# Patient Record
Sex: Female | Born: 1980 | Race: White | Hispanic: No | Marital: Married | Smoking: Never smoker
Health system: Southern US, Community
[De-identification: ages and names within clinical notes are randomized; demographics above are authoritative.]

## PROBLEM LIST (undated history)

## (undated) ENCOUNTER — Inpatient Hospital Stay: Payer: Self-pay

## (undated) DIAGNOSIS — R112 Nausea with vomiting, unspecified: Secondary | ICD-10-CM

## (undated) DIAGNOSIS — D649 Anemia, unspecified: Secondary | ICD-10-CM

## (undated) DIAGNOSIS — F329 Major depressive disorder, single episode, unspecified: Secondary | ICD-10-CM

## (undated) DIAGNOSIS — Z9889 Other specified postprocedural states: Secondary | ICD-10-CM

## (undated) DIAGNOSIS — R51 Headache: Secondary | ICD-10-CM

## (undated) DIAGNOSIS — N809 Endometriosis, unspecified: Secondary | ICD-10-CM

## (undated) DIAGNOSIS — R519 Headache, unspecified: Secondary | ICD-10-CM

## (undated) DIAGNOSIS — O139 Gestational [pregnancy-induced] hypertension without significant proteinuria, unspecified trimester: Secondary | ICD-10-CM

## (undated) DIAGNOSIS — F32A Depression, unspecified: Secondary | ICD-10-CM

## (undated) HISTORY — PX: DIAGNOSTIC LAPAROSCOPY: SUR761

## (undated) HISTORY — DX: Major depressive disorder, single episode, unspecified: F32.9

## (undated) HISTORY — DX: Depression, unspecified: F32.A

## (undated) HISTORY — DX: Endometriosis, unspecified: N80.9

## (undated) HISTORY — PX: CHOLECYSTECTOMY: SHX55

---

## 2004-12-31 ENCOUNTER — Emergency Department: Payer: Self-pay | Admitting: Emergency Medicine

## 2005-01-16 ENCOUNTER — Ambulatory Visit: Payer: Self-pay | Admitting: Unknown Physician Specialty

## 2005-03-14 ENCOUNTER — Ambulatory Visit: Payer: Self-pay | Admitting: Orthopaedic Surgery

## 2006-04-20 ENCOUNTER — Emergency Department: Payer: Self-pay | Admitting: Emergency Medicine

## 2006-04-21 ENCOUNTER — Other Ambulatory Visit: Payer: Self-pay

## 2006-05-06 ENCOUNTER — Emergency Department: Payer: Self-pay | Admitting: Emergency Medicine

## 2006-05-13 ENCOUNTER — Ambulatory Visit: Payer: Self-pay | Admitting: Family Medicine

## 2006-05-29 ENCOUNTER — Ambulatory Visit: Payer: Self-pay | Admitting: Gastroenterology

## 2006-06-27 ENCOUNTER — Ambulatory Visit: Payer: Self-pay | Admitting: Obstetrics and Gynecology

## 2007-02-19 ENCOUNTER — Ambulatory Visit: Payer: Self-pay | Admitting: Emergency Medicine

## 2007-02-21 ENCOUNTER — Ambulatory Visit: Payer: Self-pay | Admitting: Internal Medicine

## 2008-06-03 ENCOUNTER — Other Ambulatory Visit: Payer: Self-pay

## 2008-06-03 ENCOUNTER — Emergency Department: Payer: Self-pay | Admitting: Emergency Medicine

## 2008-06-30 ENCOUNTER — Ambulatory Visit: Payer: Self-pay | Admitting: Internal Medicine

## 2008-10-13 ENCOUNTER — Ambulatory Visit: Payer: Self-pay | Admitting: Sports Medicine

## 2008-11-09 ENCOUNTER — Ambulatory Visit: Payer: Self-pay | Admitting: Pain Medicine

## 2008-11-17 ENCOUNTER — Ambulatory Visit: Payer: Self-pay | Admitting: Pain Medicine

## 2008-12-01 ENCOUNTER — Ambulatory Visit: Payer: Self-pay | Admitting: Physician Assistant

## 2009-01-10 ENCOUNTER — Emergency Department: Payer: Self-pay | Admitting: Unknown Physician Specialty

## 2009-10-07 DIAGNOSIS — O139 Gestational [pregnancy-induced] hypertension without significant proteinuria, unspecified trimester: Secondary | ICD-10-CM

## 2009-10-07 HISTORY — DX: Gestational (pregnancy-induced) hypertension without significant proteinuria, unspecified trimester: O13.9

## 2009-11-23 ENCOUNTER — Encounter: Payer: Self-pay | Admitting: Maternal and Fetal Medicine

## 2010-03-23 ENCOUNTER — Observation Stay: Payer: Self-pay | Admitting: Obstetrics and Gynecology

## 2010-04-24 ENCOUNTER — Observation Stay: Payer: Self-pay | Admitting: Obstetrics and Gynecology

## 2010-05-01 ENCOUNTER — Observation Stay: Payer: Self-pay | Admitting: Obstetrics and Gynecology

## 2010-05-08 ENCOUNTER — Encounter: Payer: Self-pay | Admitting: Pediatric Cardiology

## 2010-05-14 ENCOUNTER — Observation Stay: Payer: Self-pay | Admitting: Obstetrics and Gynecology

## 2010-05-24 ENCOUNTER — Observation Stay: Payer: Self-pay | Admitting: Emergency Medicine

## 2010-05-29 ENCOUNTER — Encounter: Payer: Self-pay | Admitting: Pediatric Cardiology

## 2010-05-31 ENCOUNTER — Inpatient Hospital Stay: Payer: Self-pay

## 2010-05-31 DIAGNOSIS — O139 Gestational [pregnancy-induced] hypertension without significant proteinuria, unspecified trimester: Secondary | ICD-10-CM

## 2011-03-15 ENCOUNTER — Emergency Department: Payer: Self-pay | Admitting: Emergency Medicine

## 2014-07-22 ENCOUNTER — Emergency Department: Payer: Self-pay | Admitting: Emergency Medicine

## 2014-07-22 LAB — URINALYSIS, COMPLETE
Bilirubin,UR: NEGATIVE
Blood: NEGATIVE
GLUCOSE, UR: NEGATIVE mg/dL (ref 0–75)
KETONE: NEGATIVE
Nitrite: NEGATIVE
PROTEIN: NEGATIVE
Ph: 5 (ref 4.5–8.0)
SPECIFIC GRAVITY: 1.025 (ref 1.003–1.030)
Squamous Epithelial: 3

## 2014-07-22 LAB — LIPASE, BLOOD: Lipase: 136 U/L (ref 73–393)

## 2014-07-22 LAB — CBC WITH DIFFERENTIAL/PLATELET
BASOS ABS: 0.1 10*3/uL (ref 0.0–0.1)
Basophil %: 0.7 %
EOS PCT: 1.7 %
Eosinophil #: 0.1 10*3/uL (ref 0.0–0.7)
HCT: 45.3 % (ref 35.0–47.0)
HGB: 14.3 g/dL (ref 12.0–16.0)
LYMPHS ABS: 1.3 10*3/uL (ref 1.0–3.6)
Lymphocyte %: 15.6 %
MCH: 27.6 pg (ref 26.0–34.0)
MCHC: 31.6 g/dL — AB (ref 32.0–36.0)
MCV: 87 fL (ref 80–100)
MONO ABS: 0.6 x10 3/mm (ref 0.2–0.9)
MONOS PCT: 7.2 %
NEUTROS PCT: 74.8 %
Neutrophil #: 6 10*3/uL (ref 1.4–6.5)
PLATELETS: 379 10*3/uL (ref 150–440)
RBC: 5.19 10*6/uL (ref 3.80–5.20)
RDW: 13.9 % (ref 11.5–14.5)
WBC: 8.1 10*3/uL (ref 3.6–11.0)

## 2014-07-22 LAB — COMPREHENSIVE METABOLIC PANEL
ALK PHOS: 61 U/L
Albumin: 4.1 g/dL (ref 3.4–5.0)
Anion Gap: 7 (ref 7–16)
BILIRUBIN TOTAL: 0.6 mg/dL (ref 0.2–1.0)
BUN: 15 mg/dL (ref 7–18)
CALCIUM: 9 mg/dL (ref 8.5–10.1)
Chloride: 105 mmol/L (ref 98–107)
Co2: 25 mmol/L (ref 21–32)
Creatinine: 0.81 mg/dL (ref 0.60–1.30)
GLUCOSE: 91 mg/dL (ref 65–99)
OSMOLALITY: 274 (ref 275–301)
Potassium: 4.1 mmol/L (ref 3.5–5.1)
SGOT(AST): 19 U/L (ref 15–37)
SGPT (ALT): 22 U/L
Sodium: 137 mmol/L (ref 136–145)
Total Protein: 8.3 g/dL — ABNORMAL HIGH (ref 6.4–8.2)

## 2014-07-22 LAB — TROPONIN I: Troponin-I: <0.02 ng/mL

## 2014-07-25 ENCOUNTER — Ambulatory Visit: Payer: Self-pay | Admitting: Family Medicine

## 2014-08-01 ENCOUNTER — Ambulatory Visit: Payer: Self-pay | Admitting: Surgery

## 2014-08-02 ENCOUNTER — Ambulatory Visit: Payer: Self-pay | Admitting: Surgery

## 2014-09-16 ENCOUNTER — Ambulatory Visit: Payer: Self-pay | Admitting: Unknown Physician Specialty

## 2014-10-10 DIAGNOSIS — N803 Endometriosis of pelvic peritoneum, unspecified: Secondary | ICD-10-CM | POA: Insufficient documentation

## 2014-11-30 ENCOUNTER — Ambulatory Visit: Payer: Self-pay | Admitting: Obstetrics and Gynecology

## 2015-01-28 NOTE — Op Note (Signed)
PATIENT NAME:  Catherine Holt, Catherine Holt MR#:  [id number] DATE OF BIRTH:  1980/12/14  DATE OF PROCEDURE:  08/02/2014  PREOPERATIVE DIAGNOSIS: Chronic acalculous cholecystitis.   POSTOPERATIVE DIAGNOSIS: Chronic acalculous cholecystitis.   PROCEDURE: Laparoscopic cholecystectomy with cholangiogram.   SURGEON: Renda RollsWilton Smith, MD  ANESTHESIA: General.   INDICATIONS: This 34 year old female has a history of right upper quadrant abdominal pain and abnormally low gallbladder ejection fraction of 6%. Surgery was recommended for definitive treatment.   DESCRIPTION OF PROCEDURE: The patient was placed on the operating table in the supine position under general anesthesia. The abdomen was prepared with ChloraPrep and draped in a sterile manner. A short incision was made in the inferior aspect of the umbilicus and carried down to the deep fascia which was grasped with laryngeal hook and elevated. A Veress needle was inserted, aspirated, and irrigated with a saline solution. Next, the peritoneal cavity was inflated with carbon dioxide. The Veress needle was removed. The 10-mm cannula was inserted. The 10-mm, 0-degree laparoscope was inserted to view the peritoneal cavity. Initial inspection revealed some fatty infiltration of the liver. No other gross abnormalities were seen at this point. Another incision was made in the epigastrium, slightly to the right of the midline to introduce an 11-mm cannula. Two incisions were made in the lateral aspect of the right upper quadrant to introduce two 5-mm cannulas.   The gallbladder was retracted towards the right shoulder. It did have a lot of fatty infiltration. The porta hepatis was covered with fat. The dissection was carried out along the gallbladder neck to isolate the cystic duct from surrounding structures. The cystic artery was also dissected free from surrounding structures. The neck of the gallbladder was mobilized with incision of the visceral peritoneum. A critical view  of safety was demonstrated. An Endo Clip was placed across the cystic duct adjacent to the neck of the gallbladder. An incision was made in the cystic duct to introduce a Reddick catheter. Half-strength Conray-60 dye was injected as the cholangiogram was done with fluoroscopy demonstrating the biliary tree and prompt flow of dye into the duodenum. No retained calculi were seen. The distal common bile duct was partially obscured by overlapping shadows. The Reddick catheter was removed. The cystic duct was doubly ligated with Endo Clips and divided. The cystic artery was controlled with double Endo Clips and divided. The gallbladder was dissected free from the liver with hook and cautery and blunt dissection and completely separated. It is noted that a number of small bleeding points were cauterized. Hemostasis was subsequently intact. The gallbladder was delivered up through the infraumbilical incision and submitted in formalin for routine pathology. The right upper quadrant was further inspected. The cannulas were removed. Carbon dioxide was allowed to escape from the peritoneal cavity. Skin incisions were closed with interrupted 5-0 chromic subcuticular suture, benzoin, and Steri-Strips. Dressings were applied with paper tape. The patient tolerated surgery satisfactorily and was prepared for transfer to the recovery room.    ____________________________ Shela CommonsJ. Renda RollsWilton Smith, MD jws:lt D: 08/02/2014 12:30:03 ET T: 08/02/2014 22:16:01 ET JOB#: [id number]  cc: Adella HareJ. Wilton Smith, MD, <Dictator> Adella HareWILTON J SMITH MD ELECTRONICALLY SIGNED 08/05/2014 19:20

## 2015-01-30 LAB — SURGICAL PATHOLOGY

## 2015-03-28 DIAGNOSIS — M5430 Sciatica, unspecified side: Secondary | ICD-10-CM | POA: Insufficient documentation

## 2015-04-12 DIAGNOSIS — M79605 Pain in left leg: Secondary | ICD-10-CM | POA: Insufficient documentation

## 2015-04-12 DIAGNOSIS — R29898 Other symptoms and signs involving the musculoskeletal system: Secondary | ICD-10-CM | POA: Insufficient documentation

## 2015-04-12 DIAGNOSIS — R202 Paresthesia of skin: Secondary | ICD-10-CM | POA: Insufficient documentation

## 2015-05-26 ENCOUNTER — Encounter: Payer: Self-pay | Admitting: Neurology

## 2015-05-26 ENCOUNTER — Ambulatory Visit (INDEPENDENT_AMBULATORY_CARE_PROVIDER_SITE_OTHER): Payer: BLUE CROSS/BLUE SHIELD | Admitting: Neurology

## 2015-05-26 VITALS — BP 148/100 | HR 81 | Ht 64.0 in | Wt 208.3 lb

## 2015-05-26 DIAGNOSIS — M792 Neuralgia and neuritis, unspecified: Secondary | ICD-10-CM

## 2015-05-26 DIAGNOSIS — Z8659 Personal history of other mental and behavioral disorders: Secondary | ICD-10-CM | POA: Insufficient documentation

## 2015-05-26 DIAGNOSIS — M5417 Radiculopathy, lumbosacral region: Secondary | ICD-10-CM | POA: Diagnosis not present

## 2015-05-26 DIAGNOSIS — R202 Paresthesia of skin: Secondary | ICD-10-CM | POA: Diagnosis not present

## 2015-05-26 DIAGNOSIS — M5430 Sciatica, unspecified side: Secondary | ICD-10-CM

## 2015-05-26 MED ORDER — GABAPENTIN 300 MG PO CAPS: ORAL_CAPSULE | ORAL | Status: AC

## 2015-05-26 NOTE — Progress Notes (Signed)
[facility] Neurology Division Clinic Note - Initial Visit   Date: 05/26/2015  Catherine Holt MRN: [id number] DOB: 1980/11/12   Dear Dr. Malvin Johns:  Thank you for your kind referral of Catherine Holt for consultation of left leg pain. Although her history is well known to you, please allow [place] to reiterate it for the purpose of our medical record. The patient was accompanied to the clinic by mother who also provides collateral information.     History of Present Illness: Catherine Holt is a 34 y.o. right-handed Caucasian female with endometriosis presenting for evaluation of left leg pain.    Starting in November 2015, she developed left buttocks stabbing pain radiating into her left posterior thigh and medial lower leg.  She started physical therapy, which worsened her pain and numbness.  She has constant numbness of the sole of the left foot. Pain is worse with sitting, standing, and even laying.  Gabapentin makes her sleepy.   Pain and paresthesias spares the front of the thigh, lateral lower leg and dorsum of the foot.  Right leg is unaffected.  She endorses low back pain, which is attributed to endometriosis.  She has seen OB/GYN (Dr. Feliberto Gottron), orthopaedics (Dr. Erin Sons), and neurology (Dr. Malvin Johns). Imaging of the pelvis and lumbar spine was normal without evidence of sciatica nerve injury. She has not had any falls and does not have frank weakness, but after prolonged standing, her legs feel like "jelly".  She has tried prednisone, diclofenac, vicodin, oxycodone, cymbalta, and gabapentin; none of which provide significant relief.   She underwent cholecystectomy in October 2015.     Past Medical History  Diagnosis Date  . Depression   . Endometriosis     Past Surgical History  Procedure Laterality Date  . Cholecystectomy       Medications:  Outpatient Encounter Prescriptions as of 05/26/2015  Medication Sig  . DULoxetine (CYMBALTA) 30 MG capsule Take 30 mg by  mouth daily.  Marland Kitchen gabapentin (NEURONTIN) 300 MG capsule Take 300 mg by mouth at bedtime. 2 po qhs   No facility-administered encounter medications on file as of 05/26/2015.     Allergies:  Allergies  Allergen Reactions  . Ambien [Zolpidem Tartrate]   . Doxycycline Monohydrate Other (See Comments)    itching  . Latex Hives  . Penicillins     Family History: Family History  Problem Relation Age of Onset  . Cancer Maternal Uncle   . Hypertension Maternal Uncle   . Diabetes Mother   . Asthma Maternal Uncle   . Depression Father   . Heart disease Maternal Grandmother   . Heart disease Maternal Grandfather   . Emphysema Maternal Grandmother   . Cancer Maternal Grandfather     Social History: Social History  Substance Use Topics  . Smoking status: Never Smoker   . Smokeless tobacco: Never Used  . Alcohol Use: No   Social History   Social History Narrative   Lives with husband and child in a 1 story home.  On disability. Education: high school.    Review of Systems:  CONSTITUTIONAL: No fevers, chills, night sweats, or weight loss.   EYES: No visual changes or eye pain ENT: No hearing changes.  No history of nose bleeds.   RESPIRATORY: No cough, wheezing and shortness of breath.   CARDIOVASCULAR: Negative for chest pain, and palpitations.   GI: Negative for abdominal discomfort, blood in stools or Kueker stools.  No recent change in bowel habits.  GU:  No history of incontinence.   MUSCLOSKELETAL: +history of joint pain or swelling.  No myalgias.   SKIN: Negative for lesions, rash, and itching.   HEMATOLOGY/ONCOLOGY: Negative for prolonged bleeding, bruising easily, and swollen nodes.  No history of cancer.   ENDOCRINE: Negative for cold or heat intolerance, polydipsia or goiter.   PSYCH:  No depression or anxiety symptoms.   NEURO: As Above.   Vital Signs:  BP 148/100 mmHg  Pulse 81  Ht 5\' 4"  (1.626 m)  Wt 208 lb 5 oz (94.49 kg)  BMI 35.74 kg/m2  SpO2  98%   General Medical Exam:   General:  Well appearing, comfortable.   Eyes/ENT: see cranial nerve examination.   Neck: No masses appreciated.  Full range of motion without tenderness.  No carotid bruits. Respiratory:  Clear to auscultation, good air entry bilaterally.   Cardiac:  Regular rate and rhythm, no murmur.   Extremities:  No deformities, edema, or skin discoloration.  Skin:  No rashes or lesions.  Neurological Exam: MENTAL STATUS including orientation to time, place, person, recent and remote memory, attention span and concentration, language, and fund of knowledge is normal.  Speech is not dysarthric.  CRANIAL NERVES: II:  No visual field defects.  Unremarkable fundi.   III-IV-VI: Pupils equal round and reactive to light.  Normal conjugate, extra-ocular eye movements in all directions of gaze.  No nystagmus.  No ptosis.  V:  Normal facial sensation.    VII:  Normal facial symmetry and movements.  No pathologic facial reflexes.  VIII:  Normal hearing and vestibular function.   IX-X:  Normal palatal movement.   XI:  Normal shoulder shrug and head rotation.   XII:  Normal tongue strength and range of motion, no deviation or fasciculation.  MOTOR:  No atrophy, fasciculations or abnormal movements.  No pronator drift.  Tone is normal.    Right Upper Extremity:    Left Upper Extremity:    Deltoid  5/5   Deltoid  5/5   Biceps  5/5   Biceps  5/5   Triceps  5/5   Triceps  5/5   Wrist extensors  5/5   Wrist extensors  5/5   Wrist flexors  5/5   Wrist flexors  5/5   Finger extensors  5/5   Finger extensors  5/5   Finger flexors  5/5   Finger flexors  5/5   Dorsal interossei  5/5   Dorsal interossei  5/5   Abductor pollicis  5/5   Abductor pollicis  5/5   Tone (Ashworth scale)  0  Tone (Ashworth scale)  0   Right Lower Extremity:    Left Lower Extremity:    Hip flexors  5/5   Hip flexors  5/5   Hip extensors  5/5   Hip extensors  5-/5   Knee flexors  5/5   Knee flexors  5-/5    Knee extensors  5/5   Knee extensors  5/5   Dorsiflexors  5/5   Dorsiflexors  5/5   Plantarflexors  5/5   Plantarflexors  5/5   Inversion 5/5  Inversion 5-/5  Eversion 5/5  Eversion 5/5  Toe extensors  5/5   Toe extensors  5/5   Toe flexors  5/5   Toe flexors  5-/5   Tone (Ashworth scale)  0  Tone (Ashworth scale)  0   MSRs:  Right  Left brachioradialis 2+  brachioradialis 2+  biceps 2+  biceps 2+  triceps 2+  triceps 2+  patellar 2+  patellar 2+  ankle jerk 2+  ankle jerk 2+  Hoffman no  Hoffman no  plantar response down  plantar response down   SENSORY:  Hyperesthesia to pin prick and temperature over the left thigh and medial lower leg, reduced pin prick and temperature over the left plantar and lateral surface of the foot.   Sensation in the right lower extremity is normal.  Romberg's sign absent.   COORDINATION/GAIT: Normal finger-to- nose-finger and heel-to-shin.  Intact rapid alternating movements bilaterally. Antalgic gait favoring the right leg, unsteady with stressed and tandem gait.    IMPRESSION: Ms. Wardrop is a 34 year-old female presenting for evaluation of left leg radicular pain.  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  With S1 radiculopathy, it would be unusual to have paresthesias involving the medial lower leg.  Further, she has already had imaging of the lumbar and pelvic region which did not demonstrate nerve impingement.  I do agree with Dr. Malvin Johns that she may have dynamic nerve impingement which can be not appreciated on imaging.  To better determine the nature of her symptoms, NCS/EMG of the left lower extremity will be performed.  She may increase gabapentin to  in the morning and  at bedtime  Requested that she forward CD with images for me to review  Return to clinic in 4-6 weeks   The duration of  this appointment visit was 45 minutes of face-to-face time with the patient.  Greater than 50% of this time was spent in counseling, explanation of diagnosis, planning of further management, and coordination of care.   Thank you for allowing me to participate in patient's care.  If I can answer any additional questions, I would be pleased to do so.    Sincerely,    Donika K. Allena Katz, DO

## 2015-05-26 NOTE — Patient Instructions (Addendum)
1.  Start gabapentin  in the morning and  at bedtime 2.  EMG of the left > right  leg 3.  Return to clinic in 4-6 weeks

## 2015-05-29 NOTE — Progress Notes (Signed)
Note routed

## 2015-06-06 ENCOUNTER — Ambulatory Visit (INDEPENDENT_AMBULATORY_CARE_PROVIDER_SITE_OTHER): Payer: BLUE CROSS/BLUE SHIELD | Admitting: Neurology

## 2015-06-06 DIAGNOSIS — M5430 Sciatica, unspecified side: Secondary | ICD-10-CM | POA: Diagnosis not present

## 2015-06-06 DIAGNOSIS — M79605 Pain in left leg: Secondary | ICD-10-CM

## 2015-06-06 DIAGNOSIS — M5417 Radiculopathy, lumbosacral region: Secondary | ICD-10-CM

## 2015-06-06 DIAGNOSIS — R202 Paresthesia of skin: Secondary | ICD-10-CM

## 2015-06-06 NOTE — Procedures (Signed)
[facility] Neurology  [address] [place], [address]  [place], [place] [address] Tel: [phone] Fax:  [phone] Test Date:  06/06/2015  Patient: Catherine Holt DOB: [birth year] Physician: Nita Sickle, DO  Sex: Female Height:  Ref Phys: Nita Sickle, DO  ID#: [id number] Temp: 32.6C Technician: Judie Petit. Dean   Patient Complaints: This is a 34 year old female presenting for evaluation of low back pain radiating into her left hip and lower leg.   NCV & EMG Findings: Extensive electrodiagnostic testing of the left lower extremity and additional studies of the right shows:  1. Bilateral sural and superficial peroneal sensory responses are within normal limits. 2. Bilateral peroneal and tibial motor responses are within normal limits. 3. Bilateral H reflex studies are within normal limits. 4. There is no evidence of active or chronic motor axon loss changes affecting any of the tested muscles. Motor unit configuration and recruitment pattern is within normal limits.   Impression: This is a normal study. In particular, there is no evidence of a generalized sensorimotor polyneuropathy or lumbosacral radiculopathy affecting the left lower extremity.    ___________________________ Nita Sickle, DO    Nerve Conduction Studies Anti Sensory Summary Table   Site NR Peak (ms) Norm Peak (ms) P-T Amp (V) Norm P-T Amp  Left Sup Peroneal Anti Sensory (Ant Lat Mall)  32.6C  12 cm    2.7 <4.5 16.0 >5  Right Sup Peroneal Anti Sensory (Ant Lat Mall)  32.6C  12 cm    2.4 <4.5 17.7 >5  Left Sural Anti Sensory (Lat Mall)  32.6C  Calf    2.6 <4.5 20.2 >5  Right Sural Anti Sensory (Lat Mall)  32.6C  Calf    2.9 <4.5 14.4 >5   Motor Summary Table   Site NR Onset (ms) Norm Onset (ms) O-P Amp (mV) Norm O-P Amp Site1 Site2 Delta-0 (ms) Dist (cm) Vel (m/s) Norm Vel (m/s)  Left Peroneal Motor (Ext Dig Brev)  32.6C  Ankle    3.2 <5.5 8.9 >3 B Fib Ankle 6.5 31.0 48 >40  B Fib    9.7  8.1  Poplt B  Fib 1.8 10.0 56 >40  Poplt    11.5  7.0         Right Peroneal Motor (Ext Dig Brev)  32.6C  Ankle    2.8 <5.5 5.5 >3 B Fib Ankle 6.4 31.0 48 >40  B Fib    9.2  5.0  Poplt B Fib 1.7 10.0 59 >40  Poplt    10.9  4.8         Left Tibial Motor (Abd Hall Brev)  32.6C  Ankle    2.9 <6.0 13.9 >8 Knee Ankle 7.4 36.0 49 >40  Knee    10.3  12.4         Right Tibial Motor (Abd Hall Brev)  32.6C  Ankle    2.8 <6.0 12.4 >8 Knee Ankle 8.0 37.0 46 >40  Knee    10.8  10.5          H Reflex Studies   NR H-Lat (ms) Lat Norm (ms) L-R H-Lat (ms)  Left Tibial (Gastroc)  32.6C     30.48 <35 0.96  Right Tibial (Gastroc)  32.6C     29.52 <35 0.96   EMG   Side Muscle Ins Act Fibs Psw Fasc Number Recrt Dur Dur. Amp Amp. Poly Poly. Comment  Left AntTibialis Nml Nml Nml Nml Nml Nml Nml Nml Nml Nml Nml Nml N/A  Left Gastroc Nml Nml Nml Nml Nml Nml Nml Nml Nml Nml Nml Nml N/A  Left Flex Dig Long Nml Nml Nml Nml Nml Nml Nml Nml Nml Nml Nml Nml N/A  Left RectFemoris Nml Nml Nml Nml Nml Nml Nml Nml Nml Nml Nml Nml N/A  Left BicepsFemS Nml Nml Nml Nml Nml Nml Nml Nml Nml Nml Nml Nml N/A  Left GluteusMed Nml Nml Nml Nml Nml Nml Nml Nml Nml Nml Nml Nml N/A  Left Lumbo Parasp Low Nml Nml Nml Nml Nml Nml Nml Nml Nml Nml Nml Nml N/A  Left Semimembranosus Nml Nml Nml Nml Nml Nml Nml Nml Nml Nml Nml Nml N/A     Waveforms:

## 2015-06-08 ENCOUNTER — Ambulatory Visit: Payer: Self-pay | Admitting: Neurology

## 2015-07-07 ENCOUNTER — Ambulatory Visit (INDEPENDENT_AMBULATORY_CARE_PROVIDER_SITE_OTHER): Payer: BLUE CROSS/BLUE SHIELD | Admitting: Neurology

## 2015-07-07 ENCOUNTER — Encounter: Payer: Self-pay | Admitting: Neurology

## 2015-07-07 VITALS — BP 110/80 | HR 111 | Wt 214.1 lb

## 2015-07-07 DIAGNOSIS — M79605 Pain in left leg: Secondary | ICD-10-CM | POA: Diagnosis not present

## 2015-07-07 DIAGNOSIS — M5430 Sciatica, unspecified side: Secondary | ICD-10-CM | POA: Diagnosis not present

## 2015-07-07 MED ORDER — CYCLOBENZAPRINE HCL 5 MG PO TABS: 5.0000 mg | ORAL_TABLET | Freq: Three times a day (TID) | ORAL | Status: AC | PRN

## 2015-07-07 NOTE — Progress Notes (Signed)
Follow-up Visit   Date: 07/07/2015    Catherine Holt MRN: [id number] DOB: 05-Apr-1981   Interim History: Catherine Holt is a 34 y.o. right-handed Caucasian female with endometriosis returning to the clinic for follow-up of left leg pain.  The patient was accompanied to the clinic by mother who also provides collateral information.    History of present illness: Starting in November 2015, she developed left buttocks stabbing pain radiating into her left posterior thigh and medial lower leg. She started physical therapy, which worsened her pain and numbness. She has constant numbness of the sole of the left foot. Pain is worse with sitting, standing, and even laying. Gabapentin makes her sleepy. Pain and paresthesias spares the front of the thigh, lateral lower leg and dorsum of the foot. Right leg is unaffected. She endorses low back pain, which is attributed to endometriosis. She has seen OB/GYN (Dr. Feliberto Gottron), orthopaedics (Dr. Erin Sons), and neurology (Dr. Malvin Johns). Imaging of the pelvis and lumbar spine was normal without evidence of sciatica nerve injury. She has not had any falls and does not have frank weakness, but after prolonged standing, her legs feel like "jelly". She has tried prednisone, diclofenac, vicodin, oxycodone, cymbalta, and gabapentin; none of which provide significant relief.   She underwent cholecystectomy in October 2015.   UPDATE 07/07/2015:  She presents to discuss EMG results which was surprisingly normal, no evidence of sciatic neuropathy or lumbosacral radiculopathy.  She  feels that her leg pain has not improved and there is persistent radiating pain down her leg, especially when sitting on hard surfaces. She She is taking Cymbalta  daily and neurontin  in the morning and  at bedtime, but neither provide much relief.  She had tried PT before and stopped it due to worsening pain.    Medications:  Current Outpatient Prescriptions on  File Prior to Visit  Medication Sig Dispense Refill  . DULoxetine (CYMBALTA) 30 MG capsule Take 30 mg by mouth daily.    Marland Kitchen gabapentin (NEURONTIN) 300 MG capsule Take 1 tab in the morning and 2 tablets at bedtime 270 capsule 3   No current facility-administered medications on file prior to visit.    Allergies:  Allergies  Allergen Reactions  . Ambien [Zolpidem Tartrate]   . Doxycycline Monohydrate Other (See Comments)    itching  . Latex Hives  . Penicillins     Review of Systems:  CONSTITUTIONAL: No fevers, chills, night sweats, or weight loss.  EYES: No visual changes or eye pain ENT: No hearing changes.  No history of nose bleeds.   RESPIRATORY: No cough, wheezing and shortness of breath.   CARDIOVASCULAR: Negative for chest pain, and palpitations.   GI: Negative for abdominal discomfort, blood in stools or Robicheaux stools.  No recent change in bowel habits.   GU:  No history of incontinence.   MUSCLOSKELETAL: No history of joint pain or swelling.  +myalgias.   SKIN: Negative for lesions, rash, and itching.   ENDOCRINE: Negative for cold or heat intolerance, polydipsia or goiter.   PSYCH:  No depression or anxiety symptoms.   NEURO: As Above.   Vital Signs:  BP 110/80 mmHg  Pulse 111  Wt 214 lb 1 oz (97.098 kg)  SpO2 97%  Neurological Exam: MENTAL STATUS including orientation to time, place, person, recent and remote memory, attention span and concentration, language, and fund of knowledge is normal.  Speech is not dysarthric.  CRANIAL NERVES: Pupils equal round and reactive to light.  Face is symmetric.  MOTOR:  Motor strength is 5/5 in all extremities, except 5-/5 hip extensors and flexors.  No atrophy, fasciculations or abnormal movements.  No pronator drift.  Tone is normal.    MSRs:  Reflexes are 2+/4 throughout.  SENSORY:  Hyperesthesia to light touch over the left thigh and medial lower leg.  COORDINATION/GAIT:  Antalgic gait, favoring right leg.  Data: EMG  of the left leg 06/06/2015: This is a normal study. In particular, there is no evidence of a generalized sensorimotor polyneuropathy or lumbosacral radiculopathy affecting the left lower extremity.    IMPRESSION/PLAN: Ms. Ephraim is a 34 year-old female returning for follow-up of left leg radicular pain. 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   She may have dynamic nerve impingement which can be not appreciated on imaging. At this juncture, management is symptomatic.  Unfortunately, physical therapy made symptoms worsen.  She is taking gabapentin and cymbalta without much improvement, but there is room to titrate with each.   Patient is understandably frustrated at the lack of answers.  She is interested in seeking the opinion of Sports Medicine provider to look for musculoskeletal pain, which is reasonable.   PLAN/RECOMMENDATIONS:  1.  Start flexeril  at bedtime.  If no improvement, adjust gabapentin as follows:  at bedtime,  in the afternoon, and  at bedtime. 2.  Referral to Sports Medicine to see if there is a musculoskeletal component ?piriformis syndrome.   The duration of this appointment visit was 30 minutes of face-to-face time with the patient.  Greater than 50% of this time was spent in counseling, explanation of diagnosis, planning of further management, and coordination of care.   Thank you for allowing me to participate in patient's care.  If I can answer any additional questions, I would be pleased to do so.    Sincerely,    Donika K. Allena Katz, DO

## 2015-07-07 NOTE — Patient Instructions (Addendum)
1.  Start flexeril  at bedtime.  If no improvement, adjust gabapentin as follows:  at bedtime,  in the afternoon, and  at bedtime. 2.  Referral to Sports Medicine

## 2015-07-10 NOTE — Progress Notes (Signed)
Note faxed.

## 2015-07-13 ENCOUNTER — Ambulatory Visit
Admission: RE | Admit: 2015-07-13 | Discharge: 2015-07-13 | Disposition: A | Discharge status: Home / Self Care | Payer: BLUE CROSS/BLUE SHIELD | Source: Ambulatory Visit | Attending: Neurology | Admitting: Neurology

## 2015-07-13 ENCOUNTER — Other Ambulatory Visit: Payer: Self-pay | Admitting: Neurology

## 2015-07-13 DIAGNOSIS — M79605 Pain in left leg: Secondary | ICD-10-CM | POA: Insufficient documentation

## 2015-07-18 ENCOUNTER — Ambulatory Visit: Payer: BLUE CROSS/BLUE SHIELD | Admitting: Family Medicine

## 2015-10-08 NOTE — L&D Delivery Note (Signed)
Delivery Note  First Stage: Labor induction: 05/12/16 @2100  Cervidil induction  Augmentation: Cytotec, Pitocin Analgesia /Anesthesia intrapartum: Stadol, Epidural  SROM at 1711  Second Stage: Complete dilation at 1744 Onset of pushing at 1744 FHR second stage: Baseline: 130 bpm/ Moderate variability/ +accels/ variable decelerations to nadir of 70 bpm with good return to baseline     Delivery of a viable female "Beau" at 1752 by CNM in OA position, with easy delivery of anterior shoulder then posterior shoulder, followed by body. No nuchal cord. Baby immediately started crying and began to pink, and dried on mom's abdomen.  Skin to skin after delivery.  Cord double clamped after cessation of pulsation, cut by FOB's mother  Cord blood sample N/A   Third Stage: Placenta delivered via Shultz intact with 3 VC @ 1759 Placenta disposition: hospital disposal  Uterine tone firm with massage / bleeding minimal   1st degree perineal laceration identified  Small Peri-clitoral laceration that is hemostatic - pt. States she had a similar laceration during last delivery and declined stitch  1 figure 8 stitch placed in vaginal mucosa for additional bleeding after repair finish - closed with hemostasis   Anesthesia for repair: 1% Lidocaine 30mL and epidural Repair: 2.0 Vicryl  Est. Blood Loss (mL): 300mL  Complications: none  Mom to postpartum.  Baby to Couplet care / Skin to Skin.  Newborn: Birth Weight: Pending  Apgar Scores: 9, 9 Feeding planned: Breast and Formula  Carlean JewsMeredith Palin Tristan, CNM

## 2015-11-01 ENCOUNTER — Other Ambulatory Visit: Payer: Self-pay | Admitting: Obstetrics and Gynecology

## 2015-11-01 DIAGNOSIS — Z369 Encounter for antenatal screening, unspecified: Secondary | ICD-10-CM

## 2015-11-01 LAB — OB RESULTS CONSOLE RPR: RPR: NONREACTIVE

## 2015-11-01 LAB — OB RESULTS CONSOLE HIV ANTIBODY (ROUTINE TESTING): HIV: NONREACTIVE

## 2015-11-01 LAB — OB RESULTS CONSOLE RUBELLA ANTIBODY, IGM: Rubella: IMMUNE

## 2015-11-01 LAB — OB RESULTS CONSOLE GC/CHLAMYDIA
Chlamydia: NEGATIVE
Gonorrhea: NEGATIVE

## 2015-11-01 LAB — OB RESULTS CONSOLE HEPATITIS B SURFACE ANTIGEN: HEP B S AG: NEGATIVE

## 2015-11-01 LAB — OB RESULTS CONSOLE VARICELLA ZOSTER ANTIBODY, IGG: Varicella: IMMUNE

## 2015-11-20 ENCOUNTER — Ambulatory Visit (HOSPITAL_BASED_OUTPATIENT_CLINIC_OR_DEPARTMENT_OTHER)
Admission: RE | Admit: 2015-11-20 | Discharge: 2015-11-20 | Disposition: A | Discharge status: Home / Self Care | Payer: BLUE CROSS/BLUE SHIELD | Source: Ambulatory Visit | Attending: Obstetrics & Gynecology | Admitting: Obstetrics & Gynecology

## 2015-11-20 ENCOUNTER — Ambulatory Visit
Admission: RE | Admit: 2015-11-20 | Discharge: 2015-11-20 | Disposition: A | Discharge status: Home / Self Care | Payer: BLUE CROSS/BLUE SHIELD | Source: Ambulatory Visit | Attending: Obstetrics and Gynecology | Admitting: Obstetrics and Gynecology

## 2015-11-20 VITALS — BP 137/84 | HR 86 | Temp 98.5°F | Resp 20 | Ht 64.0 in | Wt 214.0 lb

## 2015-11-20 DIAGNOSIS — O09521 Supervision of elderly multigravida, first trimester: Secondary | ICD-10-CM

## 2015-11-20 DIAGNOSIS — Z3A12 12 weeks gestation of pregnancy: Secondary | ICD-10-CM | POA: Diagnosis not present

## 2015-11-20 DIAGNOSIS — O0991 Supervision of high risk pregnancy, unspecified, first trimester: Secondary | ICD-10-CM | POA: Diagnosis present

## 2015-11-20 DIAGNOSIS — Z8269 Family history of other diseases of the musculoskeletal system and connective tissue: Secondary | ICD-10-CM | POA: Diagnosis not present

## 2015-11-20 DIAGNOSIS — Z8279 Family history of other congenital malformations, deformations and chromosomal abnormalities: Secondary | ICD-10-CM | POA: Insufficient documentation

## 2015-11-20 DIAGNOSIS — Z369 Encounter for antenatal screening, unspecified: Secondary | ICD-10-CM

## 2015-11-20 DIAGNOSIS — O09529 Supervision of elderly multigravida, unspecified trimester: Secondary | ICD-10-CM | POA: Insufficient documentation

## 2015-11-20 HISTORY — DX: Gestational (pregnancy-induced) hypertension without significant proteinuria, unspecified trimester: O13.9
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
## 2015-11-30 ENCOUNTER — Telehealth: Payer: Self-pay | Admitting: Obstetrics and Gynecology

## 2015-11-30 LAB — INFORMASEQ(SM) WITH XY ANALYSIS
FETAL FRACTION (%): 8.4
FETAL NUMBER: 1
Gestational Age at Collection: 13.3 weeks
WEIGHT: 214 [lb_av]

## 2015-11-30 NOTE — Telephone Encounter (Signed)
The patient was informed of the results of her recent InformaSeq testing (performed at [facility]) which yielded NEGATIVE results.  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  The sensitivity for trisomy 75, trisomy 82 and trisomy 109 using this testing are reported as 99.1%, 98.3% and 98.1% respectively.  Thus, while the results of this testing are highly accurate, they are not considered diagnostic at this time.  Should more definitive information be desired, the patient may still consider amniocentesis.   As requestedby the patient, sex chromosome analysis was included for this sample.  Results were normal, though the patient requested that the gender be given by phone to her friend, Tresa Endo. This is predicted with >97% accuracy.  A maternal serum AFP only should be considered if screening for neural tube defects is desired.  Cherly Anderson, MS, CGC

## 2016-02-10 IMAGING — US ABDOMEN ULTRASOUND LIMITED
1 series · 14 of 25 positions shown · non-contrast
Comparison: None.

CLINICAL DATA: Right upper quadrant pain for 3 days

EXAM:
US ABDOMEN LIMITED - RIGHT UPPER QUADRANT

[Series 1: abdomen ultrasound limited · 0.22mm/px · 14 of 30 slices shown]
[im 1/30]
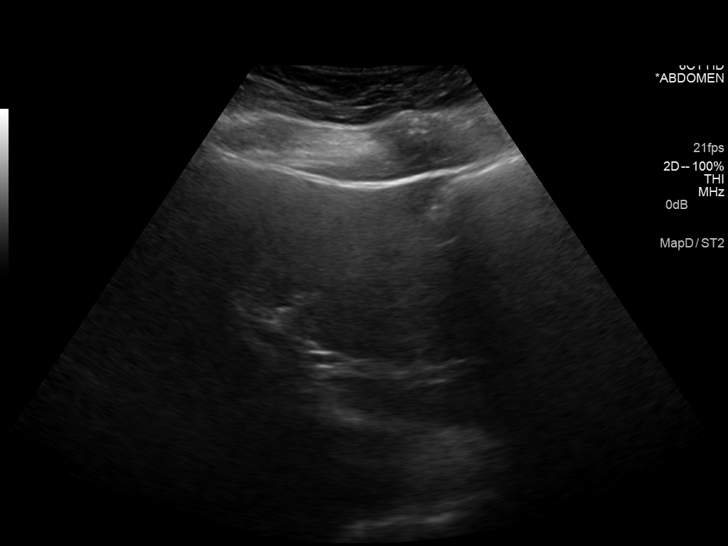
[im 3/30]
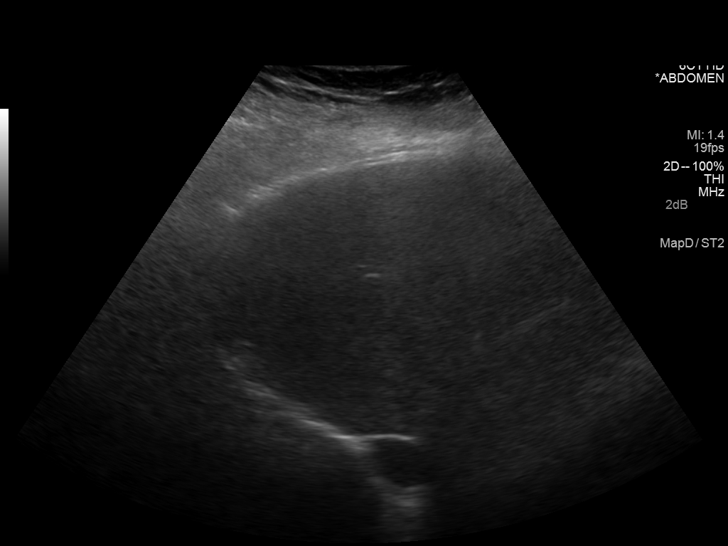
[im 5/30]
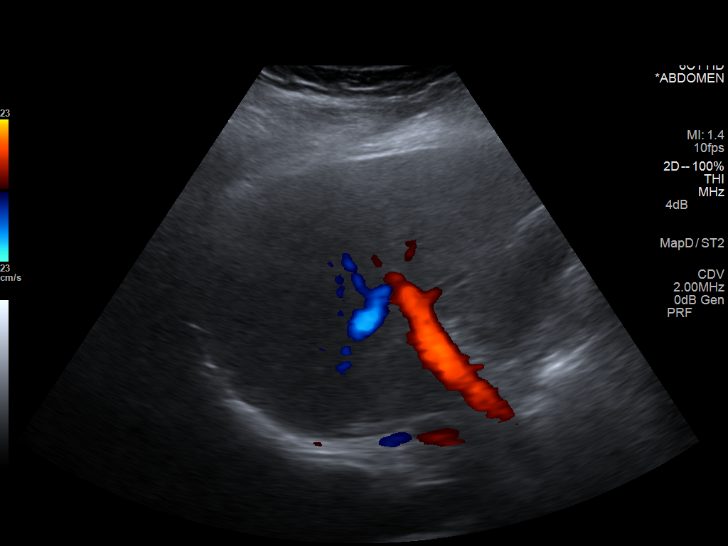
[im 8/30]
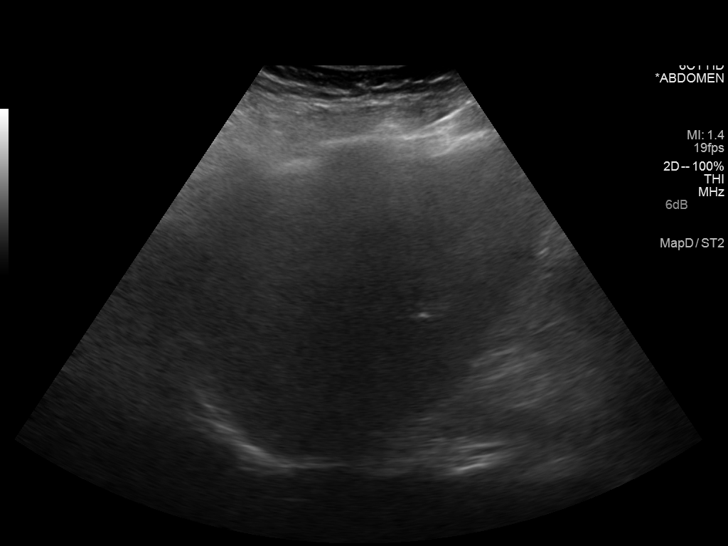
[im 10/30]
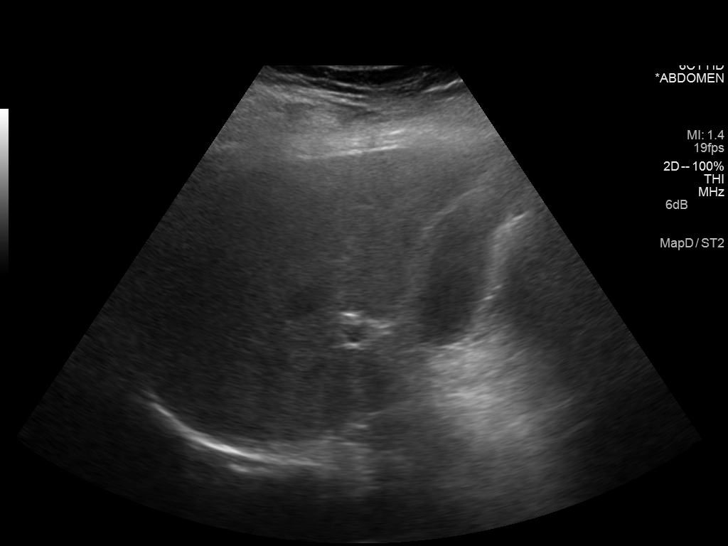
[im 11/30]
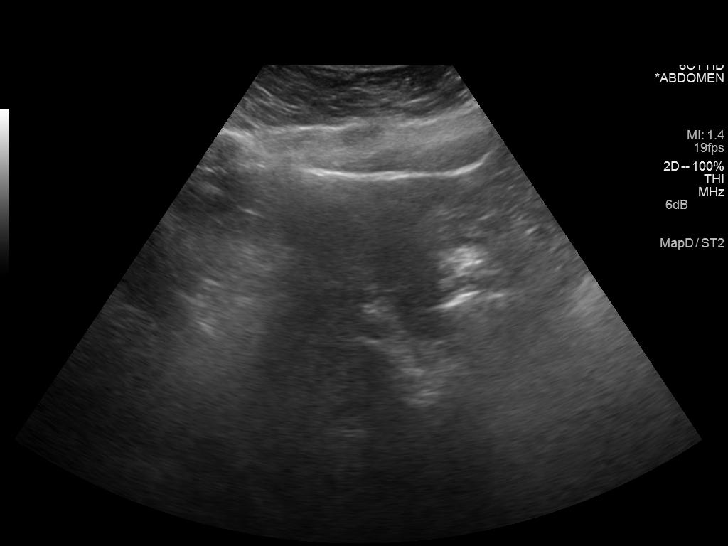
[im 14/30]
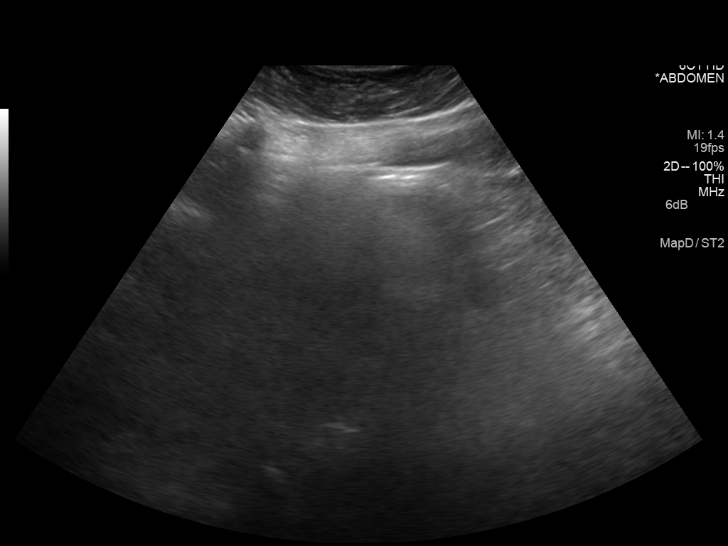
[im 16/30]
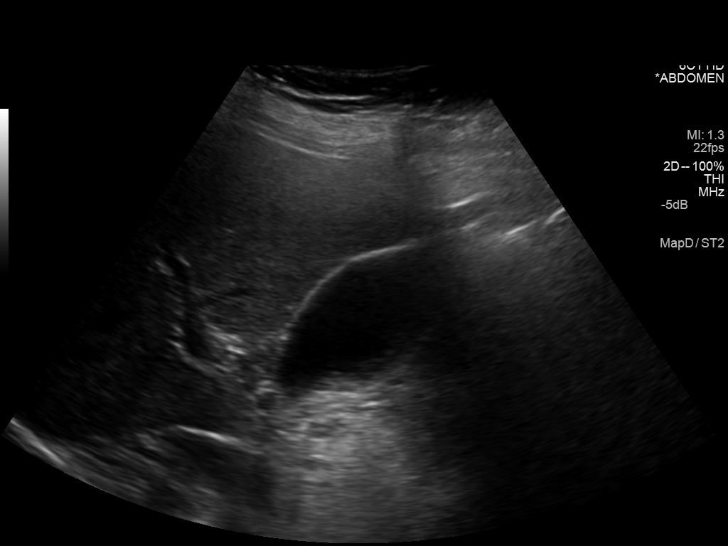
[im 19/30]
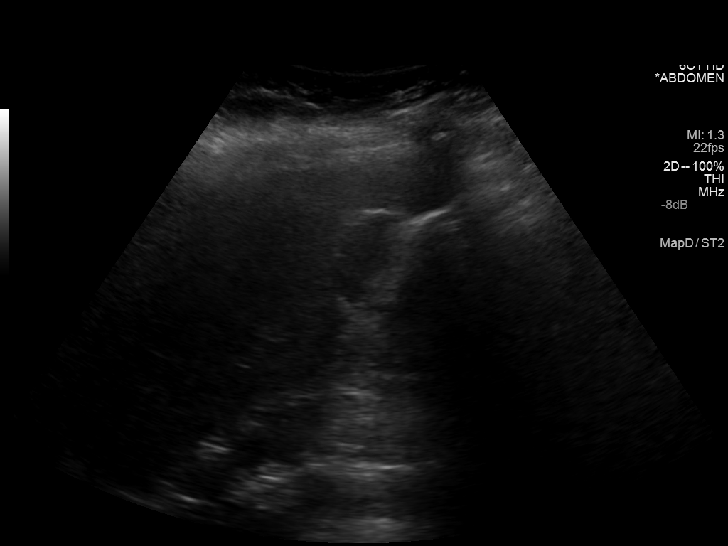
[im 20/30]
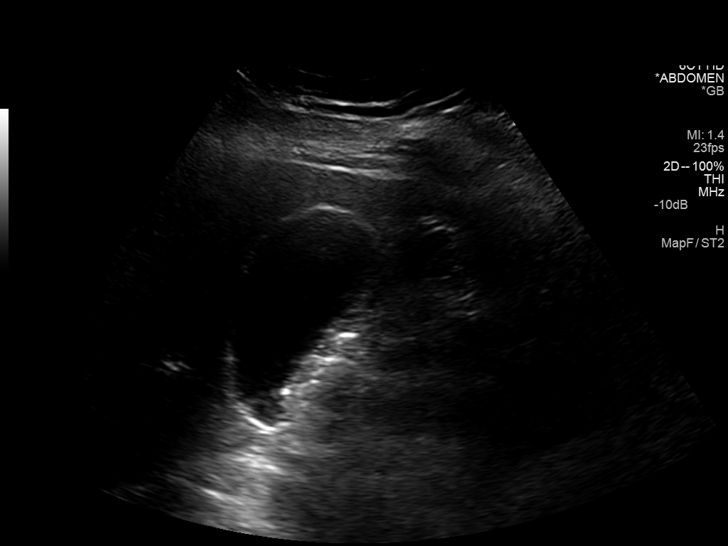
[im 22/30]
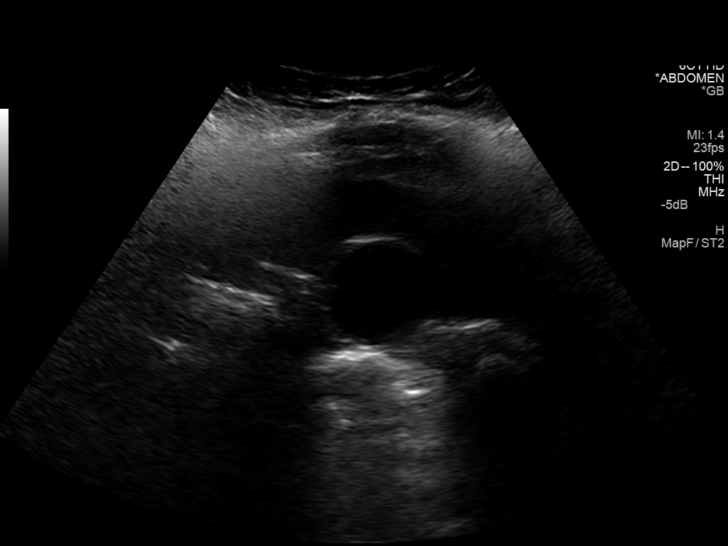
[im 25/30]
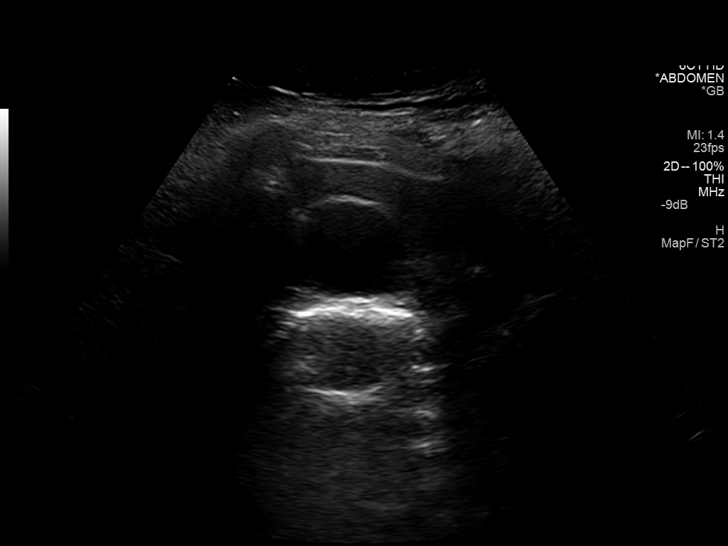
[im 27/30]
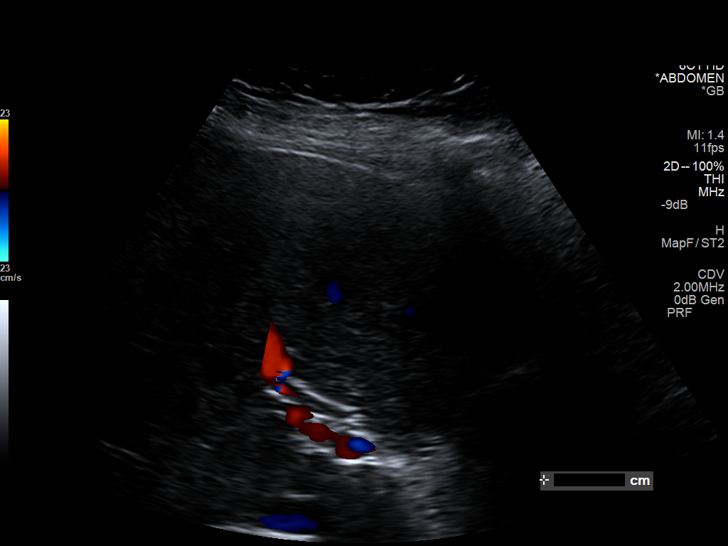
[im 30/30]
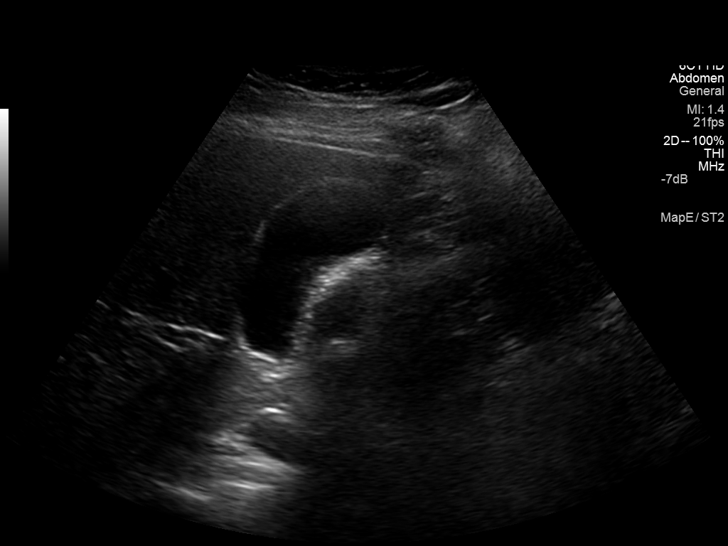

[14 of 25 positions shown; findings below may reference images not displayed]

FINDINGS: Gallbladder:

No gallstones or wall thickening visualized. Questionable small
amounts of gallbladder sludge versus artifact. No sonographic Murphy
sign noted.

Common bile duct:

Diameter: 2.9 mm

Liver:

No focal lesion identified. Within normal limits in parenchymal
echogenicity.
IMPRESSION: No cholelithiasis or sonographic evidence of acute cholecystitis.

## 2016-02-21 IMAGING — CR DG CHOLANGIOGRAM OPERATIVE
2 series · 2 of 2 positions shown · non-contrast
Comparison: Nuclear medicine HIDA scan with gallbladder ejection
fraction 07/25/2014; abdominal ultrasound 07/22/2014

CLINICAL DATA: 33-year-old female with cholelithiasis and decreased
gallbladder ejection fraction.

EXAM:
INTRAOPERATIVE CHOLANGIOGRAM
TECHNIQUE: Cholangiographic images from the C-arm fluoroscopic device were
submitted for interpretation post-operatively. Please see the
procedural report for the amount of contrast and the fluoroscopy
time utilized.

[1]
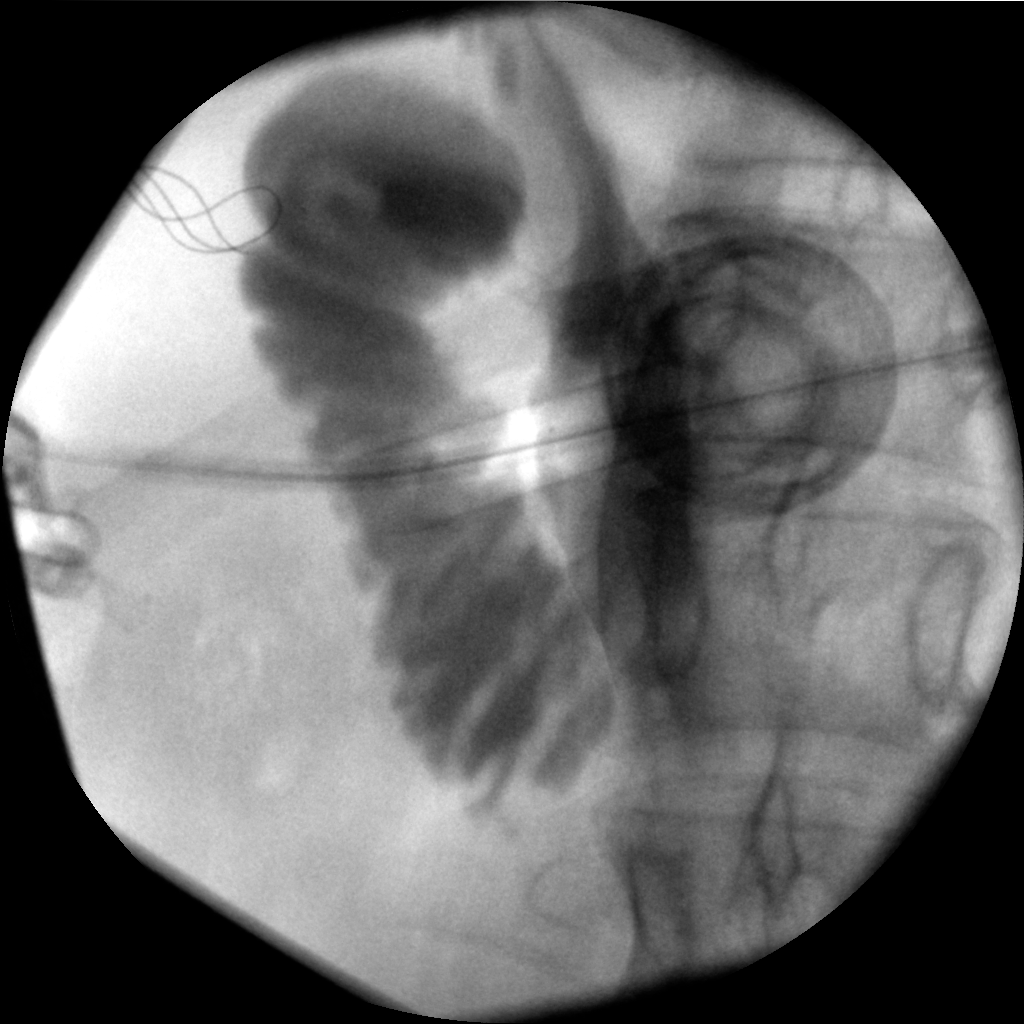

[4]
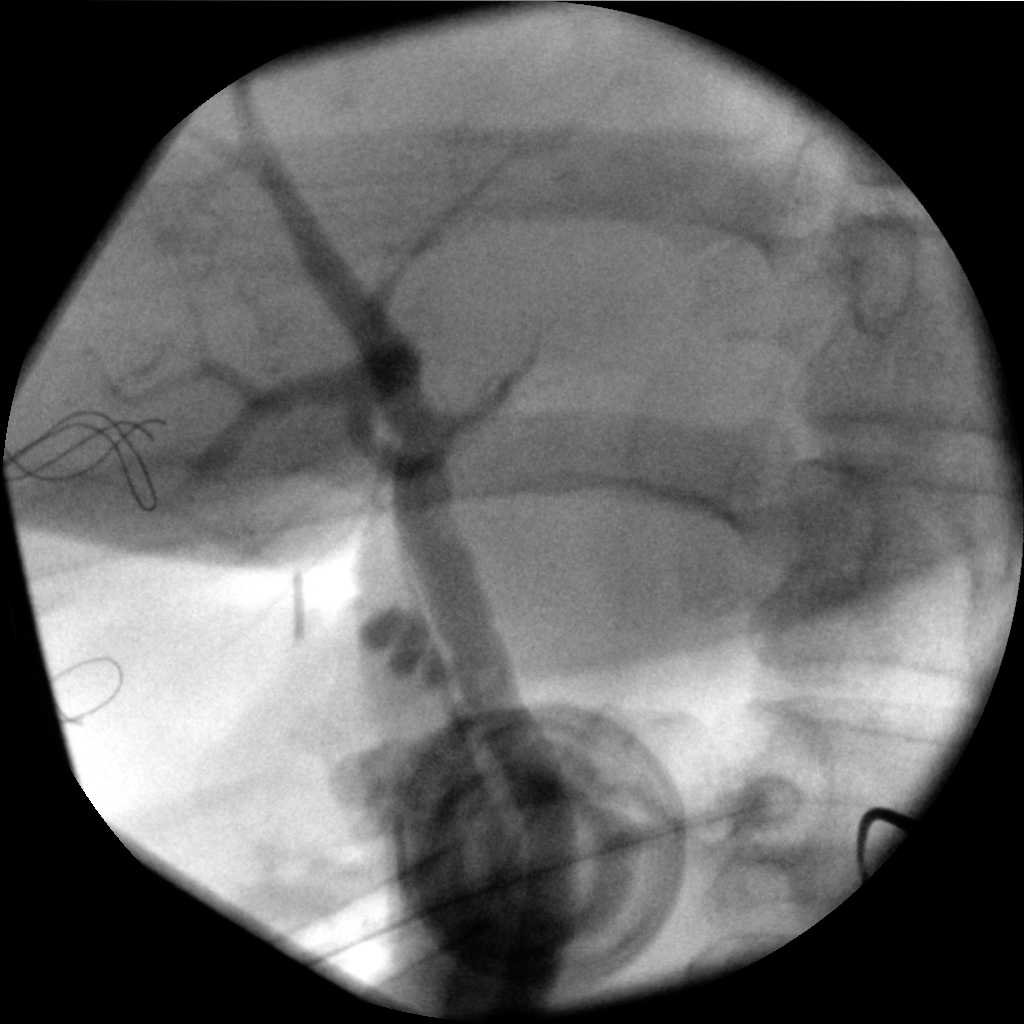

[2 of 2 positions shown; findings below may reference images not displayed]

FINDINGS: Fluoroscopic spot images obtained during intraoperative
cholangiogram at the time of laparoscopic cholecystectomy
demonstrate opacification of the cystic duct remnant, intra and
extrahepatic bile ducts and duodenum. No biliary ductal dilatation,
stenosis, stricture or choledocholithiasis. The distal common bile
duct is not particularly well seen. However, contrast material is
noted throughout the duodenum suggesting patency of the ampulla.
IMPRESSION: Negative intraoperative cholangiogram.

## 2016-03-20 ENCOUNTER — Observation Stay
Admission: EM | Admit: 2016-03-20 | Discharge: 2016-03-20 | Disposition: A | Discharge status: Home / Self Care | Payer: BLUE CROSS/BLUE SHIELD | Attending: Obstetrics and Gynecology | Admitting: Obstetrics and Gynecology

## 2016-03-20 DIAGNOSIS — O9989 Other specified diseases and conditions complicating pregnancy, childbirth and the puerperium: Secondary | ICD-10-CM | POA: Insufficient documentation

## 2016-03-20 DIAGNOSIS — O10913 Unspecified pre-existing hypertension complicating pregnancy, third trimester: Principal | ICD-10-CM | POA: Insufficient documentation

## 2016-03-20 DIAGNOSIS — R1013 Epigastric pain: Secondary | ICD-10-CM | POA: Diagnosis not present

## 2016-03-20 DIAGNOSIS — Z8279 Family history of other congenital malformations, deformations and chromosomal abnormalities: Secondary | ICD-10-CM

## 2016-03-20 DIAGNOSIS — Z3A30 30 weeks gestation of pregnancy: Secondary | ICD-10-CM | POA: Diagnosis not present

## 2016-03-20 DIAGNOSIS — O09523 Supervision of elderly multigravida, third trimester: Secondary | ICD-10-CM | POA: Insufficient documentation

## 2016-03-20 DIAGNOSIS — O139 Gestational [pregnancy-induced] hypertension without significant proteinuria, unspecified trimester: Secondary | ICD-10-CM | POA: Diagnosis present

## 2016-03-20 DIAGNOSIS — Z8269 Family history of other diseases of the musculoskeletal system and connective tissue: Secondary | ICD-10-CM

## 2016-03-20 HISTORY — DX: Nausea with vomiting, unspecified: R11.2

## 2016-03-20 HISTORY — DX: Nausea with vomiting, unspecified: Z98.890

## 2016-03-20 LAB — URINALYSIS COMPLETE WITH MICROSCOPIC (ARMC ONLY)
Bilirubin Urine: NEGATIVE
GLUCOSE, UA: NEGATIVE mg/dL
Hgb urine dipstick: NEGATIVE
KETONES UR: NEGATIVE mg/dL
Leukocytes, UA: NEGATIVE
Nitrite: NEGATIVE
Protein, ur: NEGATIVE mg/dL
SPECIFIC GRAVITY, URINE: 1.004 — AB (ref 1.005–1.030)
pH: 6 (ref 5.0–8.0)

## 2016-03-20 LAB — COMPREHENSIVE METABOLIC PANEL
ALBUMIN: 3 g/dL — AB (ref 3.5–5.0)
ALK PHOS: 68 U/L (ref 38–126)
ALT: 15 U/L (ref 14–54)
AST: 17 U/L (ref 15–41)
Anion gap: 8 (ref 5–15)
BILIRUBIN TOTAL: 0.5 mg/dL (ref 0.3–1.2)
BUN: 6 mg/dL (ref 6–20)
CO2: 20 mmol/L — AB (ref 22–32)
Calcium: 8.7 mg/dL — ABNORMAL LOW (ref 8.9–10.3)
Chloride: 106 mmol/L (ref 101–111)
Creatinine, Ser: 0.52 mg/dL (ref 0.44–1.00)
GFR calc non Af Amer: >60 mL/min (ref >60)
GLUCOSE: 86 mg/dL (ref 65–99)
Potassium: 3.7 mmol/L (ref 3.5–5.1)
SODIUM: 134 mmol/L — AB (ref 135–145)
Total Protein: 6.4 g/dL — ABNORMAL LOW (ref 6.5–8.1)

## 2016-03-20 LAB — CBC WITH DIFFERENTIAL/PLATELET
Basophils Absolute: 0 10*3/uL (ref 0–0.1)
Basophils Relative: 0 %
EOS ABS: 0.1 10*3/uL (ref 0–0.7)
Eosinophils Relative: 1 %
HEMATOCRIT: 31.2 % — AB (ref 35.0–47.0)
HEMOGLOBIN: 10.5 g/dL — AB (ref 12.0–16.0)
LYMPHS ABS: 1.2 10*3/uL (ref 1.0–3.6)
LYMPHS PCT: 14 %
MCH: 28.1 pg (ref 26.0–34.0)
MCHC: 33.5 g/dL (ref 32.0–36.0)
MCV: 83.8 fL (ref 80.0–100.0)
Monocytes Absolute: 0.9 10*3/uL (ref 0.2–0.9)
Monocytes Relative: 10 %
Neutro Abs: 6.5 10*3/uL (ref 1.4–6.5)
Neutrophils Relative %: 75 %
Platelets: 350 10*3/uL (ref 150–440)
RBC: 3.72 MIL/uL — AB (ref 3.80–5.20)
RDW: 14.2 % (ref 11.5–14.5)
WBC: 8.7 10*3/uL (ref 3.6–11.0)

## 2016-03-20 LAB — PROTEIN / CREATININE RATIO, URINE
Creatinine, Urine: 127 mg/dL
PROTEIN CREATININE RATIO: 0.13 mg/mg{creat} (ref 0.00–0.15)
TOTAL PROTEIN, URINE: 16 mg/dL

## 2016-03-20 LAB — AMYLASE: AMYLASE: 54 U/L (ref 28–100)

## 2016-03-20 LAB — LIPASE, BLOOD: Lipase: 23 U/L (ref 11–51)

## 2016-03-20 LAB — URIC ACID: Uric Acid, Serum: 4 mg/dL (ref 2.3–6.6)

## 2016-03-20 MED ORDER — CYCLOBENZAPRINE HCL 10 MG PO TABS
10.0000 mg | ORAL_TABLET | Freq: Three times a day (TID) | ORAL | Status: DC | PRN
  Administered 2016-03-20: 10 mg via ORAL
  Filled 2016-03-20: qty 1

## 2016-03-20 NOTE — Progress Notes (Signed)
UA was not a clean catch; will send clean catch with next urine.

## 2016-03-20 NOTE — OB Triage Provider Note (Signed)
TRIAGE VISIT with NST   Catherine Holt is a 35 y.o. G2P1001. She is at 5482w0d gestation.  Indication: cHTN on labatolol, Elevated Blood Pressure in office to 140s, with HD and swelling  S: Resting comfortably. no CTX, no VB. Active fetal movement. Denies headache, SOB, new onset swelling, RUQ pain, visual changes. O:  BP 117/73 mmHg  Pulse 91  Temp(Src) 98.1 F (36.7 C) (Oral)  Resp 16  Ht 5\' 4"  (1.626 m)  Wt 212 lb (96.163 kg)  BMI 36.37 kg/m2  LMP 04/19/2015 (Within Weeks) No results found for this or any previous visit (from the past 48 hour(s)).   Gen: NAD, AAOx3      Abd: FNTTP      Ext: Non-tender, Nonedmeatous   FHT: 140, mod var, +accels, no decels TOCO: quiet SVE:    NST: Category I strip, see detailed evaluation above  A/P:  [age] y.o. G2P1001 8582w0d with elevated BP in office, headache and midepigastric pain.                        Labor: not present.   R/o PreEclampsia: labs and P:C pending. Serial vitals.  Fetal Wellbeing: Reassuring Cat 1 tracing.

## 2016-03-20 NOTE — OB Triage Note (Signed)
Patient Catherine Holt came in for pre-eclampsia workup referred by [facility]aryl [facility]eredith at [facility].

## 2016-03-20 NOTE — Discharge Instructions (Signed)
Call for follow-up OB appointment.  Collect 24-hr urine sample.

## 2016-03-22 ENCOUNTER — Other Ambulatory Visit
Admission: RE | Admit: 2016-03-22 | Discharge: 2016-03-22 | Disposition: A | Discharge status: Home / Self Care | Payer: BLUE CROSS/BLUE SHIELD | Source: Ambulatory Visit | Attending: Obstetrics and Gynecology | Admitting: Obstetrics and Gynecology

## 2016-03-22 DIAGNOSIS — O133 Gestational [pregnancy-induced] hypertension without significant proteinuria, third trimester: Secondary | ICD-10-CM | POA: Insufficient documentation

## 2016-03-22 LAB — CREATININE, URINE, 24 HOUR
COLLECTION INTERVAL-UCRE24: 24 h
Creatinine, 24H Ur: 1494 mg/d (ref 600–1800)
Creatinine, Urine: 249 mg/dL
URINE TOTAL VOLUME-UCRE24: 600 mL

## 2016-04-11 ENCOUNTER — Encounter: Payer: Self-pay | Admitting: *Deleted

## 2016-04-11 ENCOUNTER — Inpatient Hospital Stay
Admission: EM | Admit: 2016-04-11 | Discharge: 2016-04-11 | Disposition: A | Discharge status: Home / Self Care | Payer: BLUE CROSS/BLUE SHIELD | Attending: Obstetrics and Gynecology | Admitting: Obstetrics and Gynecology

## 2016-04-11 DIAGNOSIS — R51 Headache: Secondary | ICD-10-CM | POA: Insufficient documentation

## 2016-04-11 DIAGNOSIS — Z8269 Family history of other diseases of the musculoskeletal system and connective tissue: Secondary | ICD-10-CM

## 2016-04-11 DIAGNOSIS — Z3A33 33 weeks gestation of pregnancy: Secondary | ICD-10-CM | POA: Diagnosis not present

## 2016-04-11 DIAGNOSIS — F329 Major depressive disorder, single episode, unspecified: Secondary | ICD-10-CM | POA: Diagnosis not present

## 2016-04-11 DIAGNOSIS — O26893 Other specified pregnancy related conditions, third trimester: Secondary | ICD-10-CM | POA: Insufficient documentation

## 2016-04-11 DIAGNOSIS — R519 Headache, unspecified: Secondary | ICD-10-CM

## 2016-04-11 DIAGNOSIS — O99213 Obesity complicating pregnancy, third trimester: Secondary | ICD-10-CM | POA: Diagnosis not present

## 2016-04-11 DIAGNOSIS — O99343 Other mental disorders complicating pregnancy, third trimester: Secondary | ICD-10-CM | POA: Diagnosis not present

## 2016-04-11 DIAGNOSIS — Z8279 Family history of other congenital malformations, deformations and chromosomal abnormalities: Secondary | ICD-10-CM

## 2016-04-11 DIAGNOSIS — O09523 Supervision of elderly multigravida, third trimester: Secondary | ICD-10-CM

## 2016-04-11 LAB — CBC
HCT: 33.1 % — ABNORMAL LOW (ref 35.0–47.0)
HEMOGLOBIN: 11.3 g/dL — AB (ref 12.0–16.0)
MCH: 28.9 pg (ref 26.0–34.0)
MCHC: 34.2 g/dL (ref 32.0–36.0)
MCV: 84.4 fL (ref 80.0–100.0)
Platelets: 315 10*3/uL (ref 150–440)
RBC: 3.92 MIL/uL (ref 3.80–5.20)
RDW: 14.8 % — ABNORMAL HIGH (ref 11.5–14.5)
WBC: 9 10*3/uL (ref 3.6–11.0)

## 2016-04-11 LAB — COMPREHENSIVE METABOLIC PANEL
ALBUMIN: 3 g/dL — AB (ref 3.5–5.0)
ALT: 13 U/L — AB (ref 14–54)
ANION GAP: 8 (ref 5–15)
AST: 13 U/L — ABNORMAL LOW (ref 15–41)
Alkaline Phosphatase: 82 U/L (ref 38–126)
BUN: 7 mg/dL (ref 6–20)
CHLORIDE: 105 mmol/L (ref 101–111)
CO2: 22 mmol/L (ref 22–32)
Calcium: 8.8 mg/dL — ABNORMAL LOW (ref 8.9–10.3)
Creatinine, Ser: 0.52 mg/dL (ref 0.44–1.00)
GFR calc non Af Amer: >60 mL/min (ref >60)
Glucose, Bld: 81 mg/dL (ref 65–99)
Potassium: 3.8 mmol/L (ref 3.5–5.1)
SODIUM: 135 mmol/L (ref 135–145)
Total Bilirubin: 0.4 mg/dL (ref 0.3–1.2)
Total Protein: 6.4 g/dL — ABNORMAL LOW (ref 6.5–8.1)

## 2016-04-11 LAB — PROTEIN / CREATININE RATIO, URINE
Creatinine, Urine: 44 mg/dL
Total Protein, Urine: <6 mg/dL

## 2016-04-11 MED ORDER — METOCLOPRAMIDE HCL 5 MG/ML IJ SOLN
20.0000 mg | Freq: Once | INTRAVENOUS | Status: AC
  Administered 2016-04-11: 20 mg via INTRAVENOUS
  Filled 2016-04-11 (×2): qty 4

## 2016-04-11 MED ORDER — LABETALOL HCL 5 MG/ML IV SOLN: 20.0000 mg | INTRAVENOUS | Status: DC | PRN

## 2016-04-11 MED ORDER — LACTATED RINGERS IV SOLN
INTRAVENOUS | Status: DC
  Administered 2016-04-11: 16:00:00 via INTRAVENOUS

## 2016-04-11 MED ORDER — HYDRALAZINE HCL 20 MG/ML IJ SOLN: 10.0000 mg | Freq: Once | INTRAMUSCULAR | Status: DC | PRN

## 2016-04-11 MED ORDER — MAGNESIUM SULFATE 2 GM/50ML IV SOLN
2.0000 g | Freq: Once | INTRAVENOUS | Status: AC
  Administered 2016-04-11: 2 g via INTRAVENOUS
  Filled 2016-04-11 (×2): qty 50

## 2016-04-11 MED ORDER — DIPHENHYDRAMINE HCL 50 MG/ML IJ SOLN
25.0000 mg | INTRAMUSCULAR | Status: AC
  Administered 2016-04-11 (×2): 25 mg via INTRAVENOUS
  Filled 2016-04-11 (×2): qty 1

## 2016-04-11 MED ORDER — BUTALBITAL-APAP-CAFFEINE 50-325-40 MG PO TABS
1.0000 | ORAL_TABLET | ORAL | Status: DC | PRN
  Administered 2016-04-11: 1 via ORAL
  Filled 2016-04-11: qty 1

## 2016-04-11 NOTE — Plan of Care (Signed)
Discharge instructions given and explained.  Verbalized understanding.  Signed copy on chart and one in hand.  Reviewed kick counts. Verbalized understanding.

## 2016-04-11 NOTE — Discharge Instructions (Signed)
Amniocentesis °Amniocentesis is a procedure to remove a small amount of the fluid that surrounds a baby in the uterus (amniotic fluid) so that it can be tested. Testing this fluid can provide important information about the baby. °Amniocentesis is most commonly done to check whether the baby has certain genetic problems and to check how well the baby's lungs have matured.  It is typically performed between 15-20 weeks of pregnancy. °LET YOUR HEALTH CARE PROVIDER KNOW ABOUT: °· Any complications you have had with your pregnancy, such as bleeding or contractions. °· Any allergies you have. °· All medicines you are taking, including vitamins, herbs, eye drops, creams, and over-the-counter medicines. °· Previous problems you or members of your family have had with the use of anesthetics. °· Any blood disorders you have. °· Previous surgeries you have had. °· Any medical conditions you may have. °· Your blood type, if it is Rh negative. °RISKS AND COMPLICATIONS °Generally this is a safe procedure. However, problems may occur, including: °· Vaginal bleeding. °· Leaking of amniotic fluid. °· Premature labor. °· Injury to the fetus. °· Injury to the placenta. °· Infection. °· Miscarriage (rare). °BEFORE THE PROCEDURE °· Drink enough fluid to keep your urine clear or pale yellow. °· You may want to arrange for someone to take you home after the procedure. °PROCEDURE °· You may be asked to undress and put on a patient gown. °· You will be asked to lie down on an exam table. °· An ultrasound will be done to determine the baby's position and the best place to remove the fluid sample. °· A solution to help prevent infection will be applied to your abdomen. Do not touch the area where the solution was applied until after the procedure. °· You may be given a medicine to numb the area (local anesthetic). It is normal to feel some cramping while the procedure is being performed, even after getting this medicine. °· A thin needle  will be inserted through the skin and into the uterus. °· The needle will be used to remove a small amount (usually less than 2 tablespoons) of amniotic fluid from the amniotic sac. °· The thin needle will be removed. °· The part of the skin where the needle was inserted will be covered with a bandage (dressing). °· The fluid that was removed will be sent for testing. °The procedure may vary among health care providers and hospitals. °AFTER THE PROCEDURE °· You may be asked to stay after the procedure for 1 to 2 hours. During this time, you and your baby will be monitored by your health care team to make sure there are no problems before you go home. °· If you are Rh negative, you may need a Rho (D) immune globulin shot. Ask your health care provider if you need to have this shot. °  °This information is not intended to replace advice given to you by your health care provider. Make sure you discuss any questions you have with your health care provider. °  °Document Released: 09/20/2000 Document Revised: 06/14/2015 Document Reviewed: 01/25/2015 °Elsevier Interactive Patient Education ©2016 [employer] ° °

## 2016-04-11 NOTE — OB Triage Provider Note (Signed)
TRIAGE VISIT with NST   Catherine Holt is a 35 y.o. G2P1001. She is at 3367w1d gestation.  Indication: Persistent HA x5days with hx of cHTN on Aldomet 250mg  BID. Also with visual spots x1 week.   S: Resting comfortably. no CTX, no VB. Active fetal movement. Denies SOB, new onset swelling, RUQ pain, +HA not relieved with Fioricet, +spots in vision.    O:  BP 123/81 mmHg  Pulse 83  Temp(Src) 98.2 F (36.8 C) (Oral)  Resp 18  Ht 5\' 4"  (1.626 m)  Wt 215 lb (97.523 kg)  BMI 36.89 kg/m2  LMP 04/19/2015 (Within Weeks) Results for orders placed or performed during the hospital encounter of 04/11/16 (from the past 48 hour(s))  Comprehensive metabolic panel   Collection Time: 04/11/16 12:10 PM  Result Value Ref Range   Sodium 135 135 - 145 mmol/L   Potassium 3.8 3.5 - 5.1 mmol/L   Chloride 105 101 - 111 mmol/L   CO2 22 22 - 32 mmol/L   Glucose, Bld 81 65 - 99 mg/dL   BUN 7 6 - 20 mg/dL   Creatinine, Ser 4.090.52 0.44 - 1.00 mg/dL   Calcium 8.8 (L) 8.9 - 10.3 mg/dL   Total Protein 6.4 (L) 6.5 - 8.1 g/dL   Albumin 3.0 (L) 3.5 - 5.0 g/dL   AST 13 (L) 15 - 41 U/L   ALT 13 (L) 14 - 54 U/L   Alkaline Phosphatase 82 38 - 126 U/L   Total Bilirubin 0.4 0.3 - 1.2 mg/dL   GFR calc non Af Amer >60 >60 mL/min   GFR calc Af Amer >60 >60 mL/min   Anion gap 8 5 - 15  CBC   Collection Time: 04/11/16 12:10 PM  Result Value Ref Range   WBC 9.0 3.6 - 11.0 K/uL   RBC 3.92 3.80 - 5.20 MIL/uL   Hemoglobin 11.3 (L) 12.0 - 16.0 g/dL   HCT 81.133.1 (L) 91.435.0 - 78.247.0 %   MCV 84.4 80.0 - 100.0 fL   MCH 28.9 26.0 - 34.0 pg   MCHC 34.2 32.0 - 36.0 g/dL   RDW 95.614.8 (H) 21.311.5 - 08.614.5 %   Platelets 315 150 - 440 K/uL  Protein / creatinine ratio, urine   Collection Time: 04/11/16 12:10 PM  Result Value Ref Range   Creatinine, Urine 44 mg/dL   Total Protein, Urine <6 mg/dL   Protein Creatinine Ratio        0.00 - 0.15 mg/mg[Cre]     Gen: NAD, AAOx3  Pulm: CTAB, no crackles     Abd: FNTTP      Ext: Non-tender,  Nonedmeatous. 1 beat of clonus bilaterally.Reflexes 2+, not brisk.    FHT: 135, mod var, +accles 15x15, no decels TOCO: quiet SVE:   deferred  NST: Category I strip, see detailed evaluation above  A/P:  35 y.o. G2P1001 5567w1d with persistent headache, normal BP and normal preE labs.                        Persistent HA: Reassuring workup for PreE. Will given migraine cocktail of mag sulfate 2g iv, reglan 20mg  q6130min x 4 doses, and benadryl 25mg  q2960min for 2 doses  Labor: not present.   R/o PreEclampsia: labs, serial BP, physical exam benign.  Fetal Wellbeing: Reassuring Cat 1 tracing.  D/c home stable, precautions reviewed, follow-up as scheduled.

## 2016-04-11 NOTE — Final Progress Note (Signed)
35yo G2P1001 at 33+1 with 3days of headache, normal BP  S: Pt improved with IVF and meds.  O: BP Readings from Last 3 Encounters:  04/11/16 123/81  03/20/16 117/74  11/20/15 137/84   Feeling much better. Headache resolved to mild dull ache PreE Labs wnl, including P:C ratio (protein too low to be calculated)  A: Headache in pregnancy P: D/c home with normal pregnancy precautions, and PreE precautions for good measure.  Fetal status reassuring throughout Normal prenatal care - f/u in office in 2 weeks.

## 2016-04-11 NOTE — H&P (Signed)
Obstetrics Admission History & Physical  Referring Provider: Milon Scorearon Jones, CNM Primary OBGYN: [facility]   Chief Complaint: HA in the frontal region x 5 days with floaters unrelieved by Tylenol. NO RUQ pain. Pt is on meds for BP.  History of Present Illness  35 y.o. G2P1001 @ 1018w1d (Dating: LMP ? & US at 7 weeks with EDD of 05/29/16). Pregnancy complicated by: CHTN on Aldomet 250 mg po tid, normal Prot/creat ratio 218 on 11/14/15, Obesity, FMH of osteogenesis imperfecta.  Ms. Catherine Holt presents for HA, blurred vision and floaters today.   Review of Systems: Positive for HA, floaters, blurred vision..  Otherwise, her 12 point review of systems is negative or as noted in the History of Present Illness.  Patient Active Problem List   Diagnosis Date Noted  . Headache 04/11/2016  . PIH (pregnancy induced hypertension) 03/20/2016  . Advanced maternal age in multigravida 11/20/2015  . Family history of congenital heart defect 11/20/2015  . Family history of osteogenesis imperfecta 11/20/2015  . H/O: depression 05/26/2015  . Left leg pain 04/12/2015  . Leg weakness 04/12/2015  . Leg paresthesia 04/12/2015  . Neuralgia neuritis, sciatic nerve 03/28/2015  . Endometriosis of pelvic peritoneum 10/10/2014      PMHx:  Past Medical History  Diagnosis Date  . Depression   . Endometriosis   . Pregnancy induced hypertension 2011  . PONV (postoperative nausea and vomiting)    PSHx:  Past Surgical History  Procedure Laterality Date  . Cholecystectomy     Medications:  Prescriptions prior to admission  Medication Sig Dispense Refill Last Dose  . acetaminophen (TYLENOL) 325 MG tablet Take 650 mg by mouth every 6 (six) hours as needed for mild pain or moderate pain.   03/20/2016 at Unknown time  . aspirin 81 MG tablet Take 81 mg by mouth daily.   03/20/2016 at Unknown time  . cyclobenzaprine (FLEXERIL) 5 MG tablet Take 1 tablet (5 mg total) by mouth every 8 (eight) hours as needed for muscle  spasms. (Patient not taking: Reported on 11/20/2015) 30 tablet 1 Not Taking  . DULoxetine (CYMBALTA) 30 MG capsule Take 30 mg by mouth daily. Reported on 03/20/2016   Not Taking at Unknown time  . gabapentin (NEURONTIN) 300 MG capsule Take 1 tab in the morning and 2 tablets at bedtime (Patient not taking: Reported on 11/20/2015) 270 capsule 3 Not Taking at Unknown time  . labetalol (NORMODYNE) 100 MG tablet Take 100 mg by mouth 3 (three) times daily.   03/20/2016 at Unknown time  . Pediatric Multivit-Minerals-C (FLINTSTONES COMPLETE PO) Take 2 tablets by mouth daily.   03/20/2016 at Unknown time   Allergies: is allergic to ambien; doxycycline monohydrate; latex; and penicillins. OBHx:  OB History  Gravida Para Term Preterm AB SAB TAB Ectopic Multiple Living  2 1 1       1     # Outcome Date GA Lbr Len/2nd Weight Sex Delivery Anes PTL Lv  2 Current           1 Term 05/31/10    M Vag-Spont   Y     Complications: Gestational hypertension     GYNHx:  History of abnormal pap smears: Neg History of STIs: neg          FHx:  Family History  Problem Relation Age of Onset  . Cancer Maternal Uncle   . Hypertension Maternal Uncle   . Diabetes Mother   . Asthma Maternal Uncle   . Depression  Father   . Heart disease Maternal Grandmother   . Emphysema Maternal Grandmother   . Heart disease Maternal Grandfather   . Cancer Maternal Grandfather   . Heart murmur Brother   . Fibroids Sister   . Depression Brother   . Depression Sister   . Healthy Son   . Cancer Paternal Aunt    Soc Hx:  Social History   Social History  . Marital Status: Married    Spouse Name: N/A  . Number of Children: N/A  . Years of Education: N/A   Occupational History  . Not on file.   Social History Main Topics  . Smoking status: Never Smoker   . Smokeless tobacco: Never Used  . Alcohol Use: No  . Drug Use: No  . Sexual Activity: Yes   Other Topics Concern  . Not on file   Social History Narrative   Lives  with husband and child in a 1 story home.     On disability.    Previously worked as Orthoptistexterminator.   Education: high school.     Objective   Filed Vitals:   04/11/16 1145  BP: 123/81  Pulse: 83  Temp: 98.2 F (36.8 C)  Resp: 18   Temp:  [98.2 F (36.8 C)] 98.2 F (36.8 C) (07/06 1145) Pulse Rate:  [83] 83 (07/06 1145) Resp:  [18] 18 (07/06 1145) BP: (123)/(81) 123/81 mmHg (07/06 1145) Weight:  [215 lb (97.523 kg)] 215 lb (97.523 kg) (07/06 1145) Temp (24hrs), Avg:98.2 F (36.8 C), Min:98.2 F (36.8 C), Max:98.2 F (36.8 C)  No intake or output data in the 24 hours ending 04/11/16 1159    Current Vital Signs 24h Vital Sign Ranges  T 98.2 F (36.8 C) Temp  Avg: 98.2 F (36.8 C)  Min: 98.2 F (36.8 C)  Max: 98.2 F (36.8 C)  BP 123/81 mmHg BP  Min: 123/81  Max: 123/81  HR 83 Pulse  Avg: 83  Min: 83  Max: 83  RR 18 Resp  Avg: 18  Min: 18  Max: 18  SaO2     No Data Recorded       24 Hour I/O Current Shift I/O  Time Ins Outs       EFM: 145, Cat 1, +accels, no decels. Reactive NST with 15 x 15 BPM Toco: No UC's seen  General: Well nourished, well developed female in no acute distress.  Skin:  Warm and dry.  Cardiovascular: Pulse reg, no JVD. Respiratory:   Normal respiratory effort.  Abdomen: Gravid, EFW 5#2oz, vtx  Neuro/Psych:  Normal mood and affect.   SVE: closed int os  Labs  pending Ultrasounds Due next week for AFI  Perinatal info  A pos// Rubella Immune / RPR NR/HIV not foudn/HepB Surf Ag NR/TDaP 03/13/16 / Flu  Not given/pap Neg 03/23/15  Assessment & Plan   35 y.o. G2P1001 @ 6740w1d with signs and symptoms, with possible pre-ecclampsia IUP: at 33 1/7 weels EAV:[id number]:unknown Headache P: Workup for pre-ecclampsia   Sharee Pimplearon W. Jones, MSN, CNM, FNP [facility]

## 2016-04-11 NOTE — OB Triage Note (Signed)
[facility] with c/o unrelenting headache.  Changed to gown and to bed.  EFM applied.  Plan of care discussed and oriented to room.  Agrees with plan and verbalized understanding.

## 2016-04-25 ENCOUNTER — Other Ambulatory Visit: Payer: Self-pay | Admitting: Obstetrics and Gynecology

## 2016-04-25 DIAGNOSIS — I10 Essential (primary) hypertension: Secondary | ICD-10-CM

## 2016-04-26 ENCOUNTER — Ambulatory Visit
Admission: RE | Admit: 2016-04-26 | Discharge: 2016-04-26 | Disposition: A | Discharge status: Home / Self Care | Payer: BLUE CROSS/BLUE SHIELD | Source: Ambulatory Visit | Attending: Obstetrics and Gynecology | Admitting: Obstetrics and Gynecology

## 2016-04-26 DIAGNOSIS — Z3A35 35 weeks gestation of pregnancy: Secondary | ICD-10-CM | POA: Diagnosis not present

## 2016-04-26 DIAGNOSIS — O10919 Unspecified pre-existing hypertension complicating pregnancy, unspecified trimester: Secondary | ICD-10-CM | POA: Diagnosis present

## 2016-04-26 DIAGNOSIS — I10 Essential (primary) hypertension: Secondary | ICD-10-CM

## 2016-04-29 LAB — OB RESULTS CONSOLE GBS: GBS: NEGATIVE

## 2016-05-09 ENCOUNTER — Inpatient Hospital Stay
Admission: EM | Admit: 2016-05-09 | Discharge: 2016-05-09 | Disposition: A | Discharge status: Home / Self Care | Payer: BLUE CROSS/BLUE SHIELD | Source: Home / Self Care | Admitting: Obstetrics and Gynecology

## 2016-05-09 DIAGNOSIS — O163 Unspecified maternal hypertension, third trimester: Secondary | ICD-10-CM | POA: Insufficient documentation

## 2016-05-09 DIAGNOSIS — Z3A37 37 weeks gestation of pregnancy: Secondary | ICD-10-CM | POA: Insufficient documentation

## 2016-05-09 LAB — COMPREHENSIVE METABOLIC PANEL
ALK PHOS: 104 U/L (ref 38–126)
ALT: 15 U/L (ref 14–54)
AST: 15 U/L (ref 15–41)
Albumin: 3 g/dL — ABNORMAL LOW (ref 3.5–5.0)
Anion gap: 8 (ref 5–15)
BILIRUBIN TOTAL: 0.5 mg/dL (ref 0.3–1.2)
BUN: 10 mg/dL (ref 6–20)
CALCIUM: 9.1 mg/dL (ref 8.9–10.3)
CO2: 22 mmol/L (ref 22–32)
CREATININE: 0.55 mg/dL (ref 0.44–1.00)
Chloride: 102 mmol/L (ref 101–111)
Glucose, Bld: 89 mg/dL (ref 65–99)
Potassium: 3.9 mmol/L (ref 3.5–5.1)
Sodium: 132 mmol/L — ABNORMAL LOW (ref 135–145)
TOTAL PROTEIN: 6.4 g/dL — AB (ref 6.5–8.1)

## 2016-05-09 LAB — CBC WITH DIFFERENTIAL/PLATELET
BASOS ABS: 0 10*3/uL (ref 0–0.1)
Basophils Relative: 1 %
EOS PCT: 1 %
Eosinophils Absolute: 0.1 10*3/uL (ref 0–0.7)
HEMATOCRIT: 31.6 % — AB (ref 35.0–47.0)
Hemoglobin: 11 g/dL — ABNORMAL LOW (ref 12.0–16.0)
LYMPHS ABS: 1.3 10*3/uL (ref 1.0–3.6)
LYMPHS PCT: 15 %
MCH: 29 pg (ref 26.0–34.0)
MCHC: 35 g/dL (ref 32.0–36.0)
MCV: 82.8 fL (ref 80.0–100.0)
MONO ABS: 0.7 10*3/uL (ref 0.2–0.9)
Monocytes Relative: 8 %
NEUTROS ABS: 6.7 10*3/uL — AB (ref 1.4–6.5)
Neutrophils Relative %: 75 %
PLATELETS: 298 10*3/uL (ref 150–440)
RBC: 3.81 MIL/uL (ref 3.80–5.20)
RDW: 14.9 % — ABNORMAL HIGH (ref 11.5–14.5)
WBC: 8.9 10*3/uL (ref 3.6–11.0)

## 2016-05-09 LAB — PROTEIN / CREATININE RATIO, URINE
CREATININE, URINE: 280 mg/dL
Protein Creatinine Ratio: 0.05 mg/mg{Cre} (ref 0.00–0.15)
Total Protein, Urine: 15 mg/dL

## 2016-05-09 LAB — URIC ACID: URIC ACID, SERUM: 5 mg/dL (ref 2.3–6.6)

## 2016-05-09 NOTE — Discharge Summary (Signed)
  Patient ID: Catherine Holt, female   DOB: 10/22/1980, 35 y.o.   MRN: [id number] Grecia B Wien 10/13/1980 G2 P1 [redacted]w[redacted]d presents for h/a elevated BP in office  No LOF , no vaginal bleeding ,CHtn  On aldomet  O;BP 107/75   Pulse 89   Temp 98.2 F (36.8 C) (Oral)   Resp 18   Ht 5\' 4"  (1.626 m)   Wt 98 kg (216 lb)   LMP 04/19/2015 (Within Weeks)   BMI 37.08 kg/m  ABDsoft NT  CX not checked  NST140-150 + accels , no decels . Reactive  Labs: all PIH labs nl A: Chtn with no evidence of PIH . Reassuring fetal monitoring  P:D/C home with precautions

## 2016-05-09 NOTE — OB Triage Note (Signed)
Sent for OB office for elevated BP and complaint of Headache.

## 2016-05-12 ENCOUNTER — Inpatient Hospital Stay
Admission: RE | Admit: 2016-05-12 | Discharge: 2016-05-15 | DRG: 774 | Disposition: A | Discharge status: Home / Self Care | Payer: BLUE CROSS/BLUE SHIELD | Attending: Obstetrics and Gynecology | Admitting: Obstetrics and Gynecology

## 2016-05-12 DIAGNOSIS — Z8269 Family history of other diseases of the musculoskeletal system and connective tissue: Secondary | ICD-10-CM

## 2016-05-12 DIAGNOSIS — F329 Major depressive disorder, single episode, unspecified: Secondary | ICD-10-CM | POA: Diagnosis present

## 2016-05-12 DIAGNOSIS — Z7982 Long term (current) use of aspirin: Secondary | ICD-10-CM

## 2016-05-12 DIAGNOSIS — Z3A37 37 weeks gestation of pregnancy: Secondary | ICD-10-CM

## 2016-05-12 DIAGNOSIS — O99214 Obesity complicating childbirth: Secondary | ICD-10-CM | POA: Diagnosis present

## 2016-05-12 DIAGNOSIS — Z79899 Other long term (current) drug therapy: Secondary | ICD-10-CM | POA: Diagnosis not present

## 2016-05-12 DIAGNOSIS — Z8249 Family history of ischemic heart disease and other diseases of the circulatory system: Secondary | ICD-10-CM

## 2016-05-12 DIAGNOSIS — Z8279 Family history of other congenital malformations, deformations and chromosomal abnormalities: Secondary | ICD-10-CM

## 2016-05-12 DIAGNOSIS — E669 Obesity, unspecified: Secondary | ICD-10-CM | POA: Diagnosis present

## 2016-05-12 DIAGNOSIS — Z6837 Body mass index (BMI) 37.0-37.9, adult: Secondary | ICD-10-CM | POA: Diagnosis not present

## 2016-05-12 DIAGNOSIS — O1092 Unspecified pre-existing hypertension complicating childbirth: Principal | ICD-10-CM | POA: Diagnosis present

## 2016-05-12 DIAGNOSIS — Z833 Family history of diabetes mellitus: Secondary | ICD-10-CM | POA: Diagnosis not present

## 2016-05-12 DIAGNOSIS — O09523 Supervision of elderly multigravida, third trimester: Secondary | ICD-10-CM

## 2016-05-12 DIAGNOSIS — O10919 Unspecified pre-existing hypertension complicating pregnancy, unspecified trimester: Secondary | ICD-10-CM | POA: Diagnosis present

## 2016-05-12 DIAGNOSIS — O99343 Other mental disorders complicating pregnancy, third trimester: Secondary | ICD-10-CM | POA: Diagnosis present

## 2016-05-12 LAB — CBC
HCT: 32.9 % — ABNORMAL LOW (ref 35.0–47.0)
HEMOGLOBIN: 11.2 g/dL — AB (ref 12.0–16.0)
MCH: 28 pg (ref 26.0–34.0)
MCHC: 34.1 g/dL (ref 32.0–36.0)
MCV: 82.2 fL (ref 80.0–100.0)
PLATELETS: 325 10*3/uL (ref 150–440)
RBC: 4 MIL/uL (ref 3.80–5.20)
RDW: 14.7 % — ABNORMAL HIGH (ref 11.5–14.5)
WBC: 9.2 10*3/uL (ref 3.6–11.0)

## 2016-05-12 LAB — COMPREHENSIVE METABOLIC PANEL
ALK PHOS: 106 U/L (ref 38–126)
ALT: 17 U/L (ref 14–54)
AST: 19 U/L (ref 15–41)
Albumin: 3.1 g/dL — ABNORMAL LOW (ref 3.5–5.0)
Anion gap: 12 (ref 5–15)
BUN: 13 mg/dL (ref 6–20)
CALCIUM: 9 mg/dL (ref 8.9–10.3)
CHLORIDE: 104 mmol/L (ref 101–111)
CO2: 17 mmol/L — AB (ref 22–32)
CREATININE: 0.5 mg/dL (ref 0.44–1.00)
GFR calc non Af Amer: >60 mL/min (ref >60)
Glucose, Bld: 85 mg/dL (ref 65–99)
Potassium: 3.8 mmol/L (ref 3.5–5.1)
SODIUM: 133 mmol/L — AB (ref 135–145)
Total Bilirubin: 0.5 mg/dL (ref 0.3–1.2)
Total Protein: 7 g/dL (ref 6.5–8.1)

## 2016-05-12 LAB — PROTEIN / CREATININE RATIO, URINE
CREATININE, URINE: 178 mg/dL
Protein Creatinine Ratio: 0.07 mg/mg{Cre} (ref 0.00–0.15)
TOTAL PROTEIN, URINE: 12 mg/dL

## 2016-05-12 LAB — LACTATE DEHYDROGENASE: LDH: 94 U/L — ABNORMAL LOW (ref 98–192)

## 2016-05-12 LAB — URIC ACID: URIC ACID, SERUM: 5 mg/dL (ref 2.3–6.6)

## 2016-05-12 MED ORDER — LIDOCAINE HCL (PF) 1 % IJ SOLN
30.0000 mL | INTRAMUSCULAR | Status: DC | PRN
  Administered 2016-05-13: 30 mL via SUBCUTANEOUS

## 2016-05-12 MED ORDER — OXYCODONE-ACETAMINOPHEN 5-325 MG PO TABS: 2.0000 | ORAL_TABLET | ORAL | Status: DC | PRN

## 2016-05-12 MED ORDER — OXYTOCIN 40 UNITS IN LACTATED RINGERS INFUSION - SIMPLE MED
2.5000 [IU]/h | INTRAVENOUS | Status: DC
  Filled 2016-05-12: qty 1000

## 2016-05-12 MED ORDER — SODIUM CHLORIDE FLUSH 0.9 % IV SOLN
INTRAVENOUS | Status: AC
  Filled 2016-05-12: qty 40

## 2016-05-12 MED ORDER — METHYLDOPA 250 MG PO TABS
250.0000 mg | ORAL_TABLET | Freq: Three times a day (TID) | ORAL | Status: DC
  Administered 2016-05-13 – 2016-05-15 (×6): 250 mg via ORAL
  Filled 2016-05-12 (×8): qty 1

## 2016-05-12 MED ORDER — SOD CITRATE-CITRIC ACID 500-334 MG/5ML PO SOLN: 30.0000 mL | ORAL | Status: DC | PRN

## 2016-05-12 MED ORDER — LACTATED RINGERS IV SOLN
500.0000 mL | INTRAVENOUS | Status: DC | PRN
  Administered 2016-05-12: 1000 mL via INTRAVENOUS
  Administered 2016-05-13: 500 mL via INTRAVENOUS

## 2016-05-12 MED ORDER — AMMONIA AROMATIC IN INHA
RESPIRATORY_TRACT | Status: AC
  Filled 2016-05-12: qty 10

## 2016-05-12 MED ORDER — MISOPROSTOL 200 MCG PO TABS
ORAL_TABLET | ORAL | Status: AC
  Filled 2016-05-12: qty 4

## 2016-05-12 MED ORDER — LIDOCAINE HCL (PF) 1 % IJ SOLN
INTRAMUSCULAR | Status: AC
  Administered 2016-05-13: 30 mL via SUBCUTANEOUS
  Filled 2016-05-12: qty 30

## 2016-05-12 MED ORDER — OXYTOCIN BOLUS FROM INFUSION
500.0000 mL | Freq: Once | INTRAVENOUS | Status: AC
  Administered 2016-05-13: 500 mL via INTRAVENOUS

## 2016-05-12 MED ORDER — TERBUTALINE SULFATE 1 MG/ML IJ SOLN: 0.2500 mg | Freq: Once | INTRAMUSCULAR | Status: DC | PRN

## 2016-05-12 MED ORDER — DINOPROSTONE 10 MG VA INST
10.0000 mg | VAGINAL_INSERT | Freq: Once | VAGINAL | Status: AC
  Administered 2016-05-12: 10 mg via VAGINAL
  Filled 2016-05-12: qty 1

## 2016-05-12 MED ORDER — LACTATED RINGERS IV SOLN
INTRAVENOUS | Status: DC
  Administered 2016-05-13: 11:00:00 via INTRAVENOUS

## 2016-05-12 MED ORDER — OXYTOCIN 10 UNIT/ML IJ SOLN
INTRAMUSCULAR | Status: AC
  Filled 2016-05-12: qty 2

## 2016-05-12 MED ORDER — ACETAMINOPHEN 325 MG PO TABS: 650.0000 mg | ORAL_TABLET | ORAL | Status: DC | PRN

## 2016-05-12 MED ORDER — ONDANSETRON HCL 4 MG/2ML IJ SOLN: 4.0000 mg | Freq: Four times a day (QID) | INTRAMUSCULAR | Status: DC | PRN

## 2016-05-12 MED ORDER — OXYCODONE-ACETAMINOPHEN 5-325 MG PO TABS
1.0000 | ORAL_TABLET | ORAL | Status: DC | PRN
  Filled 2016-05-12: qty 1

## 2016-05-12 NOTE — H&P (Signed)
OB ADMISSION/ HISTORY & PHYSICAL:  Admission Date: 05/12/2016  8:09 PM  Admit Diagnosis: 37+4 weeks IOL for Chronic Hypertension   Catherine Holt is a 35 y.o. female G2,P1, at 37+4 weeks presenting for IOL for chronic hypertension.  She has been on Aldomet 250mg  TID, and has progressively higher blood pressures and had worsening headaches, swelling, and RUQ pain last week.  Today, these symptoms have resolved.   Prenatal History: G2P1001   EDC : 05/29/2016, by Ultrasound  [facility]  Prenatal course complicated by chronic hypertension on Aldomet 250mg  TID, obesity, FMH of osteogenesis imperfecta: FOB has a sister, dad and Pat GF with OI and freq. Fx's. FOB does not have and kids should not be affected, Depression: came off Cymbalta with preg and doing ok, left sciatic pain - off Gabapentin, maternal anemia on iron supplements   Prenatal Labs: ABO, Rh: --/--/A POS (08/06 2047) Antibody: POS (08/06 2047) Rubella: Immune (01/25 0000)  Varicella: Immune  RPR: Nonreactive (01/25 0000)  HBsAg: Negative (01/25 0000)  HIV: Non-reactive (01/25 0000)  GTT: abnormal 1 hour, 3hr WNL GBS: Negative (07/24 0000)   Medical / Surgical History :  Past medical history:  Past Medical History:  Diagnosis Date  . Depression   . Endometriosis   . PONV (postoperative nausea and vomiting)   . Pregnancy induced hypertension 2011     Past surgical history:  Past Surgical History:  Procedure Laterality Date  . CHOLECYSTECTOMY      Family History:  Family History  Problem Relation Age of Onset  . Cancer Maternal Uncle   . Hypertension Maternal Uncle   . Diabetes Mother   . Asthma Maternal Uncle   . Depression Father   . Heart disease Maternal Grandmother   . Emphysema Maternal Grandmother   . Heart disease Maternal Grandfather   . Cancer Maternal Grandfather   . Heart murmur Brother   . Fibroids Sister   . Depression Brother   . Depression Sister   . Healthy Son   .  Cancer Paternal Aunt      Social History:  reports that she has never smoked. She has never used smokeless tobacco. She reports that she does not drink alcohol or use drugs.   Allergies: Ambien [zolpidem tartrate]; Doxycycline monohydrate; Latex; and Penicillins    Current Medications at time of admission:  Prior to Admission medications   Medication Sig Start Date End Date Taking? Authorizing Provider  acetaminophen (TYLENOL) 325 MG tablet Take 650 mg by mouth every 6 (six) hours as needed for mild pain or moderate pain.    Historical Provider, MD  aspirin 81 MG tablet Take 81 mg by mouth daily.    Historical Provider, MD  cyclobenzaprine (FLEXERIL) 5 MG tablet Take 1 tablet (5 mg total) by mouth every 8 (eight) hours as needed for muscle spasms. Patient not taking: Reported on 11/20/2015 07/07/15   [place] Chardonika K Patel, DO  DULoxetine (CYMBALTA) 30 MG capsule Take 30 mg by mouth daily. Reported on 03/20/2016    Historical Provider, MD  gabapentin (NEURONTIN) 300 MG capsule Take 1 tab in the morning and 2 tablets at bedtime Patient not taking: Reported on 11/20/2015 05/26/15   Roxana Hiresonika K Patel, DO  labetalol (NORMODYNE) 100 MG tablet Take 100 mg by mouth 3 (three) times daily.    Historical Provider, MD  methyldopa (ALDOMET) 250 MG tablet Take 250 mg by mouth 3 (three) times daily.    Historical Provider, MD  Pediatric Multivit-Minerals-C (  FLINTSTONES COMPLETE PO) Take 2 tablets by mouth daily.    Historical Provider, MD     Review of Systems: Active FM Irregular ctxs No LOF  / SROM  No bloody show   Physical Exam:  VS: Last menstrual period 04/19/2015.  General: alert and oriented, appears calm Heart: RRR Lungs: Clear lung fields Abdomen: Gravid, soft and non-tender, non-distended / uterus: gravid, non-tender Extremities: mild BLE edema, +2 DTRs, no clonus  Genitalia / VE: Dilation: Closed Effacement (%): 60 Station: Ballotable Exam by:: JMG  FHR: baseline rate 145bpm /  variability moderate / accelerations + / no decelerations TOCO: every 2-8 minutes   Assessment: 37+[redacted] weeks gestation Induction stage of labor FHR category    Plan:  1. Admit to Birth Place for induction of labor       - Cervidil vaginally x 1       - Routine labor and delivery orders      - Stadol  IVP every 1hour PRN      - May have epidural upon request 2. Chronic Hypertension     - Continue Aldomet  TID     - Notify MD of BP >160/110 to evaluate for IV meds/Mag sulfate     - Pre-eclampsia labs ordered 3. GBS Negative     - No prophylaxis indicated 4. Contraception     - Unknown 5. Anticipate NSVD    - Proven pelvis: 6#8oz  Dr. Dalbert Garnet notified of admission / plan of care  Carlean Jews, CNM

## 2016-05-12 NOTE — ED Notes (Signed)
Pt has arrived for induction; will be assisted to LDR 1 by ED tech Lenord FellersYessica

## 2016-05-13 ENCOUNTER — Inpatient Hospital Stay: Payer: BLUE CROSS/BLUE SHIELD | Admitting: Anesthesiology

## 2016-05-13 MED ORDER — ACETAMINOPHEN 325 MG PO TABS: 650.0000 mg | ORAL_TABLET | ORAL | Status: DC | PRN

## 2016-05-13 MED ORDER — FENTANYL 2.5 MCG/ML W/ROPIVACAINE 0.2% IN NS 100 ML EPIDURAL INFUSION (ARMC-ANES)
EPIDURAL | Status: AC
Start: 2016-05-13 — End: 2016-05-14
  Filled 2016-05-13: qty 100

## 2016-05-13 MED ORDER — OXYTOCIN 40 UNITS IN LACTATED RINGERS INFUSION - SIMPLE MED
1.0000 m[IU]/min | INTRAVENOUS | Status: DC
  Administered 2016-05-13: 1 m[IU]/min via INTRAVENOUS

## 2016-05-13 MED ORDER — PRENATAL MULTIVITAMIN CH
1.0000 | ORAL_TABLET | Freq: Every day | ORAL | Status: DC
  Administered 2016-05-14 – 2016-05-15 (×2): 1 via ORAL
  Filled 2016-05-13 (×2): qty 1

## 2016-05-13 MED ORDER — ONDANSETRON HCL 4 MG PO TABS: 4.0000 mg | ORAL_TABLET | ORAL | Status: DC | PRN

## 2016-05-13 MED ORDER — BENZOCAINE-MENTHOL 20-0.5 % EX AERO
1.0000 "application " | INHALATION_SPRAY | CUTANEOUS | Status: DC | PRN
  Administered 2016-05-13: 1 via TOPICAL
  Filled 2016-05-13: qty 56

## 2016-05-13 MED ORDER — SIMETHICONE 80 MG PO CHEW: 80.0000 mg | CHEWABLE_TABLET | ORAL | Status: DC | PRN

## 2016-05-13 MED ORDER — OXYCODONE-ACETAMINOPHEN 5-325 MG PO TABS
1.0000 | ORAL_TABLET | ORAL | Status: DC | PRN
  Administered 2016-05-13 – 2016-05-14 (×3): 1 via ORAL
  Filled 2016-05-13 (×2): qty 1

## 2016-05-13 MED ORDER — SODIUM CHLORIDE 0.9 % IV SOLN
INTRAVENOUS | Status: DC | PRN
  Administered 2016-05-13 (×3): 5 mL via EPIDURAL

## 2016-05-13 MED ORDER — MISOPROSTOL 25 MCG QUARTER TABLET
25.0000 ug | ORAL_TABLET | Freq: Once | ORAL | Status: AC
  Administered 2016-05-13: 25 ug via VAGINAL
  Filled 2016-05-13: qty 1

## 2016-05-13 MED ORDER — SENNOSIDES-DOCUSATE SODIUM 8.6-50 MG PO TABS
2.0000 | ORAL_TABLET | ORAL | Status: DC
  Administered 2016-05-14 (×2): 2 via ORAL
  Filled 2016-05-13: qty 1
  Filled 2016-05-13 (×2): qty 2

## 2016-05-13 MED ORDER — OXYCODONE-ACETAMINOPHEN 5-325 MG PO TABS: 2.0000 | ORAL_TABLET | ORAL | Status: DC | PRN

## 2016-05-13 MED ORDER — ONDANSETRON HCL 4 MG/2ML IJ SOLN: 4.0000 mg | INTRAMUSCULAR | Status: DC | PRN

## 2016-05-13 MED ORDER — BUTORPHANOL TARTRATE 1 MG/ML IJ SOLN
1.0000 mg | INTRAMUSCULAR | Status: DC | PRN
  Administered 2016-05-13: 2 mg via INTRAVENOUS
  Administered 2016-05-13: 1 mg via INTRAVENOUS
  Filled 2016-05-13 (×2): qty 2

## 2016-05-13 MED ORDER — FENTANYL 2.5 MCG/ML W/ROPIVACAINE 0.2% IN NS 100 ML EPIDURAL INFUSION (ARMC-ANES)
EPIDURAL | Status: DC | PRN
  Administered 2016-05-13: 10 mL/h via EPIDURAL

## 2016-05-13 MED ORDER — WITCH HAZEL-GLYCERIN EX PADS
1.0000 | MEDICATED_PAD | CUTANEOUS | Status: DC | PRN
Start: 2016-05-13 — End: 2016-05-15

## 2016-05-13 MED ORDER — BUTORPHANOL TARTRATE 1 MG/ML IJ SOLN
INTRAMUSCULAR | Status: AC
  Administered 2016-05-13: 1 mg via INTRAVENOUS
  Filled 2016-05-13: qty 1

## 2016-05-13 MED ORDER — COCONUT OIL OIL
1.0000 "application " | TOPICAL_OIL | Status: DC | PRN
  Administered 2016-05-13: 1 via TOPICAL
  Filled 2016-05-13: qty 120

## 2016-05-13 MED ORDER — IBUPROFEN 600 MG PO TABS
600.0000 mg | ORAL_TABLET | Freq: Four times a day (QID) | ORAL | Status: DC
  Administered 2016-05-13 – 2016-05-15 (×6): 600 mg via ORAL
  Filled 2016-05-13 (×6): qty 1

## 2016-05-13 MED ORDER — LIDOCAINE-EPINEPHRINE (PF) 1.5 %-1:200000 IJ SOLN
INTRAMUSCULAR | Status: DC | PRN
  Administered 2016-05-13: 2 mL

## 2016-05-13 MED ORDER — DIBUCAINE 1 % RE OINT: 1.0000 "application " | TOPICAL_OINTMENT | RECTAL | Status: DC | PRN

## 2016-05-13 NOTE — Anesthesia Preprocedure Evaluation (Addendum)
Anesthesia Evaluation  Patient identified by MRN, date of birth, ID band Patient awake    Reviewed: Allergy & Precautions, NPO status , Patient's Chart, lab work & pertinent test results  History of Anesthesia Complications (+) PONV  Airway Mallampati: II  TM Distance: >3 FB Neck ROM: Full    Dental no notable dental hx.    Pulmonary neg pulmonary ROS, neg sleep apnea, neg COPD,    breath sounds clear to auscultation- rhonchi (-) wheezing      Cardiovascular Exercise Tolerance: Good hypertension (gHTN), (-) CAD and (-) Past MI  Rhythm:Regular Rate:Normal - Systolic murmurs and - Diastolic murmurs    Neuro/Psych  Headaches, Depression    GI/Hepatic negative GI ROS, Neg liver ROS,   Endo/Other  negative endocrine ROSneg diabetes  Renal/GU negative Renal ROS     Musculoskeletal negative musculoskeletal ROS (+)   Abdominal Gravid abdomen   Peds  Hematology negative hematology ROS (+)   Anesthesia Other Findings Past Medical History: No date: Depression No date: Endometriosis No date: PONV (postoperative nausea and vomiting) 2011: Pregnancy induced hypertension   Reproductive/Obstetrics (+) Pregnancy                            Anesthesia Physical Anesthesia Plan  ASA: II  Anesthesia Plan: Epidural   Post-op Pain Management:    Induction:   Airway Management Planned:   Additional Equipment:   Intra-op Plan:   Post-operative Plan:   Informed Consent: I have reviewed the patients History and Physical, chart, labs and discussed the procedure including the risks, benefits and alternatives for the proposed anesthesia with the patient or authorized representative who has indicated his/her understanding and acceptance.     Plan Discussed with: Anesthesiologist and CRNA  Anesthesia Plan Comments: (Plan for epidural for labor. Discussed epidural vs spinal vs GA if need for  c-section)        Lab Results  Component Value Date   WBC 9.2 05/12/2016   HGB 11.2 (L) 05/12/2016   HCT 32.9 (L) 05/12/2016   MCV 82.2 05/12/2016   PLT 325 05/12/2016    Anesthesia Quick Evaluation

## 2016-05-13 NOTE — Progress Notes (Signed)
S:  Cervidil pulled at 0900 and per RN external os was 3cm, and internal os was closed      Pt. Very uncomfortable with contractions   O:  VS: Blood pressure 132/77, pulse 69, temperature 97.8 F (36.6 C), temperature source Oral, resp. rate (!) 22, height 5\' 4"  (1.626 m), weight 103 kg (227 lb), last menstrual period 04/19/2015.        FHR : baseline 130 bpm/ variability moderate / accelerations + / no decelerations        Toco: contractions every 2-4 minutes / mild-moderate         Cervix : Dilation: Closed Effacement (%): 60 Cervical Position: Posterior Station: Ballotable Presentation: Vertex Exam by:: C RN        Membranes:  intact  A: Induction of labor     FHR category 1  P: Cytotec 25mcg vaginally x 1      Will reassess at 4 hours       Stadol PRN for pain control     Anticipate NSVD  Carlean JewsMeredith Sigmon, CNM

## 2016-05-13 NOTE — Progress Notes (Signed)
After delivery, RN and I were discussing patient, and she had a positive antibody screen.  The lab result did not flag as abnormal, so no cord blood was drawn d/t late discovery.  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  I consulted Syliva OvermanSarah Croop, Neonatal Nurse Practitioner, and she advised to check a Direct Coombs test on the baby, and she will monitor the results.  At this time, we do not do anything and monitor for the results.  She placed the orders, and will make a formal consult if the baby's screen is abnormal.    Carlean JewsMeredith Chemeka Filice, CNM

## 2016-05-13 NOTE — Progress Notes (Signed)
I am assuming care of this patient with Dr. Dalbert GarnetBeasley as my back-up physician.  S:  Did not rest well overnight - states she started feeling strong ctxs around 0200, and they have gradually increased.  She received one dose of Stadol around 0615.  She denies HA, visual disturbances, or epigastric pain.   O:  VS: Blood pressure 112/79, pulse 72, temperature 97.8 F (36.6 C), temperature source Oral, resp. rate (!) 22, height 5\' 4"  (1.626 m), weight 103 kg (227 lb), last menstrual period 04/19/2015.        FHR : baseline 125 bpm / variability moderate / accelerations + / no decelerations        Toco: contractions every 2-5 minutes / moderate         Cervix : deferred        Membranes: intact  A: Induction of labor for Chronic HTN - BPs stable      FHR category 1  P: Remove Cervidil at 0900     Pt. May shower/wash-up after Cervidil, but should remain on clear liquids     Begin Pitocin at 1000 starting at 1 milliunit and increasing by 2 milliunits      May have epidural upon request     Anticipate NSVD  Carlean JewsMeredith Sigmon, CNM

## 2016-05-13 NOTE — Anesthesia Procedure Notes (Signed)
Epidural Patient location during procedure: OB Start time: 05/13/2016 5:00 PM End time: 05/13/2016 5:20 PM  Staffing Anesthesiologist: Priscella MannPENWARDEN, Noam Karaffa Performed: anesthesiologist   Preanesthetic Checklist Completed: patient identified, site marked, surgical consent, pre-op evaluation, timeout performed, IV checked, risks and benefits discussed and monitors and equipment checked  Epidural Patient position: sitting Prep: ChloraPrep Patient monitoring: heart rate, continuous pulse ox and blood pressure Approach: midline Location: L4-L5 Injection technique: LOR saline  Needle:  Needle type: Tuohy  Needle gauge: 18 G Needle length: 9 cm and 9 Needle insertion depth: 7.5 cm Catheter type: closed end flexible Catheter size: 20 Guage Catheter at skin depth: 11.5 cm Test dose: negative (0.125% bupivacaine)  Assessment Events: blood not aspirated, injection not painful, no injection resistance, negative IV test and no paresthesia  Additional Notes   Patient tolerated the insertion well without complications.Reason for block:procedure for pain

## 2016-05-13 NOTE — Anesthesia Postprocedure Evaluation (Signed)
Anesthesia Post Note  Patient: Kadance B Fitch  Procedure(s) Performed: * No procedures listed *  Patient location during evaluation: Mother Baby Anesthesia Type: Epidural Level of consciousness: awake and alert Pain management: pain level controlled Vital Signs Assessment: post-procedure vital signs reviewed and stable Respiratory status: spontaneous breathing, nonlabored ventilation and respiratory function stable Cardiovascular status: stable Postop Assessment: no headache, no backache and epidural receding Anesthetic complications: no    Last Vitals:  Vitals:   05/13/16 1919 05/13/16 1947  BP: 114/70 115/70  Pulse: 65 63  Resp:  18  Temp:      Last Pain:  Vitals:   05/13/16 1909  TempSrc: Axillary  PainSc: 0-No pain                 Zoei Amison

## 2016-05-13 NOTE — Progress Notes (Signed)
S:  Hurting with contractions and desires another dose of IV pain medication       Cytotec 25mcg vaginally placed at 0930     Unable to tolerate vaginal exams due to vaginal irritation from cervidil and cytotec  O:  VS: Blood pressure (!) 148/85, pulse 73, temperature 98.1 F (36.7 C), temperature source Oral, resp. rate (!) 22, height 5\' 4"  (1.626 m), weight 103 kg (227 lb), last menstrual period 04/19/2015.        FHR : baseline 145 bpm / variability moderate / accelerations + / no decelerations        Toco: contractions every 2-4 minutes / mild-moderate         Cervix : Dilation: Fingertip Effacement (%): 60 Cervical Position: Posterior Station: Ballotable Presentation: Vertex Exam by:: M. Sigmon CNM        Membranes: intact  A: Induction labor     FHR category 1  P: Pt. May wash up at sink and perineal care advised       Begin Pitocin at 1milliunit and increase by 2 milliunits after 4 hours s/p Cytotec       Discussed possible foley bulb if irritation subsides      Stadol PRN for pain      Reassess  in 1-2 hours     Anticipate NSVD  Carlean JewsMeredith Sigmon, CNM

## 2016-05-14 LAB — COMPREHENSIVE METABOLIC PANEL
ALK PHOS: 93 U/L (ref 38–126)
ALT: 22 U/L (ref 14–54)
AST: 27 U/L (ref 15–41)
Albumin: 2.3 g/dL — ABNORMAL LOW (ref 3.5–5.0)
Anion gap: 7 (ref 5–15)
BUN: 7 mg/dL (ref 6–20)
CHLORIDE: 107 mmol/L (ref 101–111)
CO2: 24 mmol/L (ref 22–32)
CREATININE: 0.55 mg/dL (ref 0.44–1.00)
Calcium: 8.7 mg/dL — ABNORMAL LOW (ref 8.9–10.3)
GFR calc Af Amer: >60 mL/min (ref >60)
Glucose, Bld: 78 mg/dL (ref 65–99)
Potassium: 4.3 mmol/L (ref 3.5–5.1)
Sodium: 138 mmol/L (ref 135–145)
Total Bilirubin: 0.5 mg/dL (ref 0.3–1.2)
Total Protein: 5.4 g/dL — ABNORMAL LOW (ref 6.5–8.1)

## 2016-05-14 LAB — CBC
HCT: 32.8 % — ABNORMAL LOW (ref 35.0–47.0)
HEMOGLOBIN: 10.9 g/dL — AB (ref 12.0–16.0)
MCH: 27.9 pg (ref 26.0–34.0)
MCHC: 33.3 g/dL (ref 32.0–36.0)
MCV: 83.8 fL (ref 80.0–100.0)
PLATELETS: 277 10*3/uL (ref 150–440)
RBC: 3.92 MIL/uL (ref 3.80–5.20)
RDW: 14.9 % — ABNORMAL HIGH (ref 11.5–14.5)
WBC: 11.8 10*3/uL — AB (ref 3.6–11.0)

## 2016-05-14 LAB — RPR: RPR Ser Ql: NONREACTIVE

## 2016-05-14 LAB — TYPE AND SCREEN
ABO/RH(D): A POS
ANTIBODY SCREEN: POSITIVE
DAT, IGG: POSITIVE
DAT, complement: NEGATIVE

## 2016-05-14 LAB — URIC ACID: Uric Acid, Serum: 4.8 mg/dL (ref 2.3–6.6)

## 2016-05-14 LAB — LACTATE DEHYDROGENASE: LDH: 191 U/L (ref 98–192)

## 2016-05-14 NOTE — Progress Notes (Signed)
Post Partum Day 1 Subjective: Baby is not latching well, Lactation will be here at 9am  Objective: Blood pressure 123/75, pulse 75, temperature 97.9 F (36.6 C), temperature source Oral, resp. rate 20, height 5\' 4"  (1.626 m), weight 227 lb (103 kg), last menstrual period 04/19/2015, SpO2 98 %, unknown if currently breastfeeding.  Physical Exam:  General: A,A & O x 3, States she feels well.  Lochia:mod, no clots. Heart: S1S2, RRR, no M/R/G. Lungs: CTA Bilat, no W/R/R.  Uterine Fundus: U-2, prior the uterus was high and now it is down. Voiding well.  DVT Evaluation: neg Homan's   Recent Labs  05/12/16 2047 05/14/16 0522  HGB 11.2* 10.9*  HCT 32.9* 32.8*  WBC 9.2 11.8*  PLT 325 277    Assessment/Plan: 1. PPD#! 2. Lactation  P; DC in am 2. Lacatation to work with pt and baby who latches but, is not nursing well. Worked with touching baby's cheek and he bagan to suck. 3. BP doing well.   LOS: 2 days   Sharee PimpleCaron W Jones 05/14/2016, 8:23 AM

## 2016-05-15 MED ORDER — IBUPROFEN 800 MG PO TABS: 800.0000 mg | ORAL_TABLET | Freq: Three times a day (TID) | ORAL | 0 refills | Status: AC | PRN

## 2016-05-15 MED ORDER — ASCORBIC ACID 250 MG PO TABS: 250.0000 mg | ORAL_TABLET | Freq: Two times a day (BID) | ORAL | 3 refills | Status: AC

## 2016-05-15 MED ORDER — FERROUS SULFATE 325 (65 FE) MG PO TABS
325.0000 mg | ORAL_TABLET | Freq: Two times a day (BID) | ORAL | 3 refills | Status: AC
Start: 2016-05-15 — End: 2016-07-14

## 2016-05-15 NOTE — Discharge Instructions (Signed)
Care After Vaginal Delivery °Congratulations on your new baby!! ° °Refer to this sheet in the next few weeks. These discharge instructions provide you with information on caring for yourself after delivery. Your caregiver may also give you specific instructions. Your treatment has been planned according to the most current medical practices available, but problems sometimes occur. Call your caregiver if you have any problems or questions after you go home. ° °HOME CARE INSTRUCTIONS °· Take over-the-counter or prescription medicines only as directed by your caregiver or pharmacist. °· Do not drink alcohol, especially if you are breastfeeding or taking medicine to relieve pain. °· Do not chew or smoke tobacco. °· Do not use illegal drugs. °· Continue to use good perineal care. Good perineal care includes: °¨ Wiping your perineum from front to back. °¨ Keeping your perineum clean. °· Do not use tampons or douche until your caregiver says it is okay. °· Shower, wash your hair, and take tub baths as directed by your caregiver. °· Wear a well-fitting bra that provides breast support. °· Eat healthy foods. °· Drink enough fluids to keep your urine clear or pale yellow. °· Eat high-fiber foods such as whole grain cereals and breads, brown rice, beans, and fresh fruits and vegetables every day. These foods may help prevent or relieve constipation. °· Follow your caregiver's recommendations regarding resumption of activities such as climbing stairs, driving, lifting, exercising, or traveling. Specifically, no driving for two weeks, so that you are comfortable reacting quickly in an emergency. °· Talk to your caregiver about resuming sexual activities. Resumption of sexual activities is dependent upon your risk of infection, your rate of healing, and your comfort and desire to resume sexual activity. Usually we recommend waiting about six weeks, or until your bleeding stops and you are interested in sex. °· Try to have someone  help you with your household activities and your newborn for at least a few days after you leave the hospital. Even longer is better. °· Rest as much as possible. Try to rest or take a nap when your newborn is sleeping. Sleep deprivation can be very hard after delivery. °· Increase your activities gradually. °· Keep all of your scheduled postpartum appointments. It is very important to keep your scheduled follow-up appointments. At these appointments, your caregiver will be checking to make sure that you are healing physically and emotionally. ° °SEEK MEDICAL CARE IF:  °· You are passing large clots from your vagina.  °· You have a foul smelling discharge from your vagina. °· You have trouble urinating. °· You are urinating frequently. °· You have pain when you urinate. °· You have a change in your bowel movements. °· You have increasing redness, pain, or swelling near your vaginal incision (episiotomy) or vaginal tear. °· You have pus draining from your episiotomy or vaginal tear. °· Your episiotomy or vaginal tear is separating. °· You have painful, hard, or reddened breasts. °· You have a severe headache. °· You have blurred vision or see spots. °· You feel sad or depressed. °· You have thoughts of hurting yourself or your newborn. °· You have questions about your care, the care of your newborn, or medicines. °· You are dizzy or light-headed. °· You have a rash. °· You have nausea or vomiting. °· You were breastfeeding and have not had a menstrual period within 12 weeks after you stopped breastfeeding. °· You are not breastfeeding and have not had a menstrual period by the 12th week after delivery. °· You   have a fever.  SEEK IMMEDIATE MEDICAL CARE IF:   You have persistent pain.  You have chest pain.  You have shortness of breath.  You faint.  You have leg pain.  You have stomach pain.  Your vaginal bleeding saturates two or more sanitary pads in 1 hour.  MAKE SURE YOU:   Understand these  instructions.  Will get help right away if you are not doing well or get worse.   Document Released: 09/20/2000 Document Revised: 02/07/2014 Document Reviewed: 05/20/2012  [facility] Patient Information 2015 [employer], [place]. This information is not intended to replace advice given to you by your health care provider. Make sure you discuss any questions you have with your health care provider. Care After Vaginal Delivery Congratulations on your new baby!!  Refer to this sheet in the next few weeks. These discharge instructions provide you with information on caring for yourself after delivery. Your caregiver may also give you specific instructions. Your treatment has been planned according to the most current medical practices available, but problems sometimes occur. Call your caregiver if you have any problems or questions after you go home.  HOME CARE INSTRUCTIONS  Take over-the-counter or prescription medicines only as directed by your caregiver or pharmacist.  Do not drink alcohol, especially if you are breastfeeding or taking medicine to relieve pain.  Do not chew or smoke tobacco.  Do not use illegal drugs.  Continue to use good perineal care. Good perineal care includes:  Wiping your perineum from front to back.  Keeping your perineum clean.  Do not use tampons or douche until your caregiver says it is okay.  Shower, wash your hair, and take tub baths as directed by your caregiver.  Wear a well-fitting bra that provides breast support.  Eat healthy foods.  Drink enough fluids to keep your urine clear or pale yellow.  Eat high-fiber foods such as whole grain cereals and breads, brown rice, beans, and fresh fruits and vegetables every day. These foods may help prevent or relieve constipation.  Follow your caregiver's recommendations regarding resumption of activities such as climbing stairs, driving, lifting, exercising, or traveling. Specifically, no driving for two weeks,  so that you are comfortable reacting quickly in an emergency.  Talk to your caregiver about resuming sexual activities. Resumption of sexual activities is dependent upon your risk of infection, your rate of healing, and your comfort and desire to resume sexual activity. Usually we recommend waiting about six weeks, or until your bleeding stops and you are interested in sex.  Try to have someone help you with your household activities and your newborn for at least a few days after you leave the hospital. Even longer is better.  Rest as much as possible. Try to rest or take a nap when your newborn is sleeping. Sleep deprivation can be very hard after delivery.  Increase your activities gradually.  Keep all of your scheduled postpartum appointments. It is very important to keep your scheduled follow-up appointments. At these appointments, your caregiver will be checking to make sure that you are healing physically and emotionally.  SEEK MEDICAL CARE IF:   You are passing large clots from your vagina.   You have a foul smelling discharge from your vagina.  You have trouble urinating.  You are urinating frequently.  You have pain when you urinate.  You have a change in your bowel movements.  You have increasing redness, pain, or swelling near your vaginal incision (episiotomy) or vaginal tear.  You  have pus draining from your episiotomy or vaginal tear.  Your episiotomy or vaginal tear is separating.  You have painful, hard, or reddened breasts.  You have a severe headache.  You have blurred vision or see spots.  You feel sad or depressed.  You have thoughts of hurting yourself or your newborn.  You have questions about your care, the care of your newborn, or medicines.  You are dizzy or light-headed.  You have a rash.  You have nausea or vomiting.  You were breastfeeding and have not had a menstrual period within 12 weeks after you stopped breastfeeding.  You are not  breastfeeding and have not had a menstrual period by the 12th week after delivery.  You have a fever.  SEEK IMMEDIATE MEDICAL CARE IF:   You have persistent pain.  You have chest pain.  You have shortness of breath.  You faint.  You have leg pain.  You have stomach pain.  Your vaginal bleeding saturates two or more sanitary pads in 1 hour.  MAKE SURE YOU:   Understand these instructions.  Will get help right away if you are not doing well or get worse.   Document Released: 09/20/2000 Document Revised: 02/07/2014 Document Reviewed: 05/20/2012  [facility] Patient Information 2015 [place], [place]. This information is not intended to replace advice given to you by your health care provider. Make sure you discuss any questions you have with your health care provider.

## 2016-05-15 NOTE — Discharge Summary (Signed)
Obstetric Discharge Summary Reason for Admission: induction of labor and for gHTN Prenatal Procedures: NST and ultrasound Intrapartum Procedures: spontaneous vaginal delivery Postpartum Procedures: none Complications-Operative and Postpartum: none Hemoglobin  Date Value Ref Range Status  05/14/2016 10.9 (L) 12.0 - 16.0 g/dL Final   HGB  Date Value Ref Range Status  07/22/2014 14.3 12.0 - 16.0 g/dL Final   HCT  Date Value Ref Range Status  05/14/2016 32.8 (L) 35.0 - 47.0 % Final  07/22/2014 45.3 35.0 - 47.0 % Final    Physical Exam:  General: alert, cooperative and appears stated age 69Lochia: appropriate Uterine Fundus: firm DVT Evaluation: No evidence of DVT seen on physical exam.  Discharge Diagnoses: Term Pregnancy-delivered  Discharge Information: Date: 05/15/2016 Activity: pelvic rest Diet: routine Medications: Ibuprofen and Iron Condition: stable Instructions: refer to practice specific booklet Discharge to: home   Newborn Data: Live born female "Catherine Holt" Birth Weight: 6 lb 14.8 oz (3140 g) APGAR: 9, 9  Home with mother.  Christeen DouglasBEASLEY, Catherine Holt 05/15/2016, 9:16 AM

## 2016-05-15 NOTE — Discharge Summary (Signed)
Patient discharged home, discharge instructions given, patient states understanding. Patient left floor in stable condition, denies any other needs at this time. Patient to follow up in 1 week for BP check. Mom and baby bands verified with RN and patient

## 2016-08-06 NOTE — H&P (Signed)
Ms. Catherine Holt is a 35 y.o. female here forLAVH + bilateral salpingectomy and possible left oophorectomy .Pt with continued left pelvic pain / left sciatica pain presumed to be endometriosis. Laparoscopy done in 2007 and had appearance of endometriosis and fulguration of the areas in question were performed . Pt is now PP and pain is returning . She has undergone  androgen , ocps , depo lupron gabapentin all without adequate response . She is s/p 2 SVD . She is interested in definitive tx .    Past Medical History:  has a past medical history of Anemia, unspecified; Depression, unspecified; Endometriosis determined by laparoscopy; Endometriosis of uterus; Headache; History of anemia; History of depression; Hypertension; and Obesity, unspecified.  Past Surgical History:  has a past surgical history that includes Laparoscopic cholecystectomy (08/02/14); Pelvic laparoscopy (2007); and laparoscopy diagnostic (2007). Family History: family history includes Breast cancer in her other; Colon cancer in her other; Diabetes mellitus in her mother; Heart attack in her other; Hypertension in her mother; Lung cancer in her other. Social History:  reports that she has never smoked. She has never used smokeless tobacco. She reports that she does not drink alcohol or use illicit drugs. OB/GYN History:  OB History    Gravida Para Term Preterm AB Living   2 2 2  0 0 2   SAB TAB Ectopic Multiple Live Births   0 0 0  2      Obstetric Comments   Gestational hypertension       Allergies: is allergic to ambien [zolpidem]; latex; penicillin; and vibramycin [doxycycline calcium]. Medications:  Current Outpatient Prescriptions:  .  acetaminophen (TYLENOL) 325 MG tablet, Take 650 mg by mouth every 4 (four) hours as needed for Pain. Reported on 11/01/2015 , Disp: , Rfl:  .  aspirin 81 MG chewable tablet, Take 81 mg by mouth once daily., Disp: , Rfl:  .  citalopram (CELEXA) 20 MG tablet, Take 1 tablet (20 mg  total) by mouth once daily., Disp: 30 tablet, Rfl: 11 .  methyldopa (ALDOMET) 250 MG tablet, Take 1 tablet (250 mg total) by mouth 2 (two) times daily., Disp: 180 tablet, Rfl: 1  Review of Systems: General:                      No fatigue or weight loss Eyes:                           No vision changes Ears:                            No hearing difficulty Respiratory:                No cough or shortness of breath Pulmonary:                  No asthma or shortness of breath Cardiovascular:           No chest pain, palpitations, dyspnea on exertion Gastrointestinal:          No abdominal bloating, chronic diarrhea, constipations, masses, pain or hematochezia Genitourinary:             No hematuria, dysuria, abnormal vaginal discharge, pelvic pain, Menometrorrhagia Lymphatic:                   No swollen lymph nodes Musculoskeletal:  No muscle weakness Neurologic:                  No extremity weakness, syncope, seizure disorder Psychiatric:                  No history of depression, delusions or suicidal/homicidal ideation    Exam:      Vitals:   07/08/16 1034  BP: 120/86  Pulse: 96    Body mass index is 34.67 kg/(m^2).  WDWN white/  female in NAD   Lungs: CTA  CV : RRR without murmur    Neck:  no thyromegaly Abdomen: soft , no mass, normal active bowel sounds,  non-tender, no rebound tenderness Pelvic: tanner stage 5 ,  External genitalia: vulva /labia no lesions Urethra: no prolapse Vagina: normal physiologic d/c Cervix: no lesions, no cervical motion tenderness   Uterus: normal size shape and contour, non-tender Adnexa: no mass, left TTP , no mass   Impression:   The primary encounter diagnosis was Chronic female pelvic pain. A diagnosis of Endometriosis was also pertinent to this visit.    Plan:   I have spoken with the patient regarding treatment options including expectant management, hormonal options, or surgical intervention. After a  full discussion the pt elects to proceed with LAVH + bilateral salpingectomy , possible left oophorectomy  Benefits and risks to surgery: The proposed benefit of the surgery has been discussed with the patient. The possible risks include, but are not limited to: organ injury to the bowel , bladder, ureters, and major blood vessels and nerves. There is a possibility of additional surgeries resulting from these injuries. There is also the risk of blood transfusion and the need to receive blood products during or after the procedure which may rarely lead to HIV or Hepatitis C infection. There is a risk of developing a deep venous thrombosis or a pulmonary embolism . There is the possibility of wound infection and also anesthetic complications, even the rare possibility of death. The patient understands these risks and wishes to proceed. All questions have been answered and the consent has been signed.

## 2016-08-12 ENCOUNTER — Encounter
Admission: RE | Admit: 2016-08-12 | Discharge: 2016-08-12 | Disposition: A | Discharge status: Home / Self Care | Payer: BLUE CROSS/BLUE SHIELD | Source: Ambulatory Visit | Attending: Obstetrics and Gynecology | Admitting: Obstetrics and Gynecology

## 2016-08-12 HISTORY — DX: Anemia, unspecified: D64.9

## 2016-08-12 HISTORY — DX: Headache: R51

## 2016-08-12 HISTORY — DX: Headache, unspecified: R51.9

## 2016-08-12 NOTE — Patient Instructions (Addendum)
  Your procedure is scheduled on:08/19/16 Report to Day Surgery. [facility]LO[facility] To find out your arrival time please call [phone] between 1PM - 3PM on 08/16/16  Remember: Instructions that are not followed completely may result in serious medical risk, up to and including death, or upon the discretion of your surgeon and anesthesiologist your surgery may need to be rescheduled.    _X___ 1. Do not eat food or drink liquids after midnight. No gum chewing or hard candies.     __X__ 2. No Alcohol for 24 hours before or after surgery.   ____ 3. Do Not Smoke For 24 Hours Prior to Your Surgery.   ____ 4. Bring all medications with you on the day of surgery if instructed.    __X__ 5. Notify your doctor if there is any change in your medical condition     (cold, fever, infections).       Do not wear jewelry, make-up, hairpins, clips or nail polish.  Do not wear lotions, powders, or perfumes. You may wear deodorant.  Do not shave 48 hours prior to surgery. Men may shave face and neck.  Do not bring valuables to the hospital.    [facility] belongings or valuables.               Contacts, dentures or bridgework may not be worn into surgery.  Leave your suitcase in the car. After surgery it may be brought to your room.  For patients admitted to the hospital, discharge time is determined by your                treatment team.   Patients discharged the day of surgery will not be allowed to drive home.   X____ Take these medicines the morning of surgery with A SIP OF WATER:    1.ALDOMET  2.   3.   4.  5.  6.  __X__ Fleet Enema (as directed) AM OF SURGERY 1 HOUR BEFORE LEAVING TO COME FOR SURGERY   ___X_ Use CHG Soap as directed  ____ Use inhalers on the day of surgery  ____ Stop metformin 2 days prior to surgery    ____ Take 1/2 of usual insulin dose the night before surgery and none on the morning of surgery.   ____ Stop  Coumadin/Plavix/aspirin on   _X___ Stop Anti-inflammatories on     STOP ADVIL UNTIL AFTER SURGERY   ____ Stop supplements until after surgery.    ____ Bring C-Pap to the hospital.   PRACTICE INCENTIVE SPIROMETRY

## 2016-08-13 ENCOUNTER — Ambulatory Visit
Admission: RE | Admit: 2016-08-13 | Discharge: 2016-08-13 | Disposition: A | Discharge status: Home / Self Care | Payer: BLUE CROSS/BLUE SHIELD | Source: Ambulatory Visit | Attending: Obstetrics and Gynecology | Admitting: Obstetrics and Gynecology

## 2016-08-13 DIAGNOSIS — Z01818 Encounter for other preprocedural examination: Secondary | ICD-10-CM | POA: Insufficient documentation

## 2016-08-13 LAB — BASIC METABOLIC PANEL
Anion gap: 8 (ref 5–15)
BUN: 12 mg/dL (ref 6–20)
CALCIUM: 9 mg/dL (ref 8.9–10.3)
CO2: 27 mmol/L (ref 22–32)
CREATININE: 0.77 mg/dL (ref 0.44–1.00)
Chloride: 103 mmol/L (ref 101–111)
GFR calc Af Amer: >60 mL/min (ref >60)
GFR calc non Af Amer: >60 mL/min (ref >60)
GLUCOSE: 114 mg/dL — AB (ref 65–99)
Potassium: 3.8 mmol/L (ref 3.5–5.1)
Sodium: 138 mmol/L (ref 135–145)

## 2016-08-13 LAB — CBC
HCT: 40.5 % (ref 35.0–47.0)
HEMOGLOBIN: 13.3 g/dL (ref 12.0–16.0)
MCH: 27.5 pg (ref 26.0–34.0)
MCHC: 32.9 g/dL (ref 32.0–36.0)
MCV: 83.6 fL (ref 80.0–100.0)
PLATELETS: 414 10*3/uL (ref 150–440)
RBC: 4.85 MIL/uL (ref 3.80–5.20)
RDW: 14.4 % (ref 11.5–14.5)
WBC: 5.8 10*3/uL (ref 3.6–11.0)

## 2016-08-13 LAB — TYPE AND SCREEN
ABO/RH(D): A POS
Antibody Screen: NEGATIVE
Extend sample reason: UNDETERMINED

## 2016-08-19 ENCOUNTER — Encounter
Admission: RE | Disposition: A | Discharge status: Home / Self Care | Payer: Self-pay | Source: Ambulatory Visit | Attending: Obstetrics and Gynecology

## 2016-08-19 ENCOUNTER — Encounter: Payer: Self-pay | Admitting: *Deleted

## 2016-08-19 ENCOUNTER — Ambulatory Visit: Payer: BLUE CROSS/BLUE SHIELD | Admitting: Anesthesiology

## 2016-08-19 ENCOUNTER — Observation Stay
Admission: RE | Admit: 2016-08-19 | Discharge: 2016-08-20 | Disposition: A | Discharge status: Home / Self Care | Payer: BLUE CROSS/BLUE SHIELD | Source: Ambulatory Visit | Attending: Obstetrics and Gynecology | Admitting: Obstetrics and Gynecology

## 2016-08-19 DIAGNOSIS — Z801 Family history of malignant neoplasm of trachea, bronchus and lung: Secondary | ICD-10-CM | POA: Diagnosis not present

## 2016-08-19 DIAGNOSIS — Z6836 Body mass index (BMI) 36.0-36.9, adult: Secondary | ICD-10-CM | POA: Diagnosis not present

## 2016-08-19 DIAGNOSIS — Z803 Family history of malignant neoplasm of breast: Secondary | ICD-10-CM | POA: Diagnosis not present

## 2016-08-19 DIAGNOSIS — Z888 Allergy status to other drugs, medicaments and biological substances status: Secondary | ICD-10-CM | POA: Insufficient documentation

## 2016-08-19 DIAGNOSIS — D649 Anemia, unspecified: Secondary | ICD-10-CM | POA: Insufficient documentation

## 2016-08-19 DIAGNOSIS — G8929 Other chronic pain: Principal | ICD-10-CM | POA: Insufficient documentation

## 2016-08-19 DIAGNOSIS — E669 Obesity, unspecified: Secondary | ICD-10-CM | POA: Diagnosis not present

## 2016-08-19 DIAGNOSIS — Z8249 Family history of ischemic heart disease and other diseases of the circulatory system: Secondary | ICD-10-CM | POA: Insufficient documentation

## 2016-08-19 DIAGNOSIS — M5432 Sciatica, left side: Secondary | ICD-10-CM | POA: Insufficient documentation

## 2016-08-19 DIAGNOSIS — Z880 Allergy status to penicillin: Secondary | ICD-10-CM | POA: Insufficient documentation

## 2016-08-19 DIAGNOSIS — F329 Major depressive disorder, single episode, unspecified: Secondary | ICD-10-CM | POA: Insufficient documentation

## 2016-08-19 DIAGNOSIS — Z91040 Latex allergy status: Secondary | ICD-10-CM | POA: Diagnosis not present

## 2016-08-19 DIAGNOSIS — Z23 Encounter for immunization: Secondary | ICD-10-CM | POA: Insufficient documentation

## 2016-08-19 DIAGNOSIS — R102 Pelvic and perineal pain: Secondary | ICD-10-CM | POA: Diagnosis not present

## 2016-08-19 DIAGNOSIS — Z800 Family history of malignant neoplasm of digestive organs: Secondary | ICD-10-CM | POA: Insufficient documentation

## 2016-08-19 DIAGNOSIS — Z7982 Long term (current) use of aspirin: Secondary | ICD-10-CM | POA: Insufficient documentation

## 2016-08-19 DIAGNOSIS — I10 Essential (primary) hypertension: Secondary | ICD-10-CM | POA: Insufficient documentation

## 2016-08-19 DIAGNOSIS — Z881 Allergy status to other antibiotic agents status: Secondary | ICD-10-CM | POA: Diagnosis not present

## 2016-08-19 DIAGNOSIS — Z9889 Other specified postprocedural states: Secondary | ICD-10-CM

## 2016-08-19 HISTORY — PX: LAPAROSCOPIC VAGINAL HYSTERECTOMY WITH SALPINGECTOMY: SHX6680

## 2016-08-19 HISTORY — PX: CYSTOSCOPY: SHX5120

## 2016-08-19 LAB — POCT PREGNANCY, URINE: Preg Test, Ur: NEGATIVE

## 2016-08-19 SURGERY — HYSTERECTOMY, VAGINAL, LAPAROSCOPY-ASSISTED, WITH SALPINGECTOMY
Anesthesia: General | Site: Bladder

## 2016-08-19 MED ORDER — DEXAMETHASONE SODIUM PHOSPHATE 10 MG/ML IJ SOLN
INTRAMUSCULAR | Status: DC | PRN
  Administered 2016-08-19: 10 mg via INTRAVENOUS

## 2016-08-19 MED ORDER — FLUORESCEIN SODIUM 10 % IV SOLN
INTRAVENOUS | Status: AC
  Filled 2016-08-19: qty 5

## 2016-08-19 MED ORDER — CEFOXITIN SODIUM-DEXTROSE 2-2.2 GM-% IV SOLR (PREMIX)
INTRAVENOUS | Status: AC
  Administered 2016-08-19: 2000 mg
  Filled 2016-08-19: qty 50

## 2016-08-19 MED ORDER — BUPIVACAINE HCL 0.5 % IJ SOLN
INTRAMUSCULAR | Status: DC | PRN
  Administered 2016-08-19: 12 mL

## 2016-08-19 MED ORDER — ONDANSETRON HCL 4 MG PO TABS: 4.0000 mg | ORAL_TABLET | Freq: Four times a day (QID) | ORAL | Status: DC | PRN

## 2016-08-19 MED ORDER — LIDOCAINE HCL (PF) 1 % IJ SOLN
INTRAMUSCULAR | Status: AC
  Filled 2016-08-19: qty 10

## 2016-08-19 MED ORDER — OXYCODONE-ACETAMINOPHEN 5-325 MG PO TABS
1.0000 | ORAL_TABLET | ORAL | Status: DC | PRN
  Administered 2016-08-20: 1 via ORAL
  Filled 2016-08-19: qty 1

## 2016-08-19 MED ORDER — ONDANSETRON HCL 4 MG/2ML IJ SOLN: 4.0000 mg | Freq: Once | INTRAMUSCULAR | Status: DC | PRN

## 2016-08-19 MED ORDER — LACTATED RINGERS IV SOLN
INTRAVENOUS | Status: DC
  Administered 2016-08-19 – 2016-08-20 (×3): via INTRAVENOUS

## 2016-08-19 MED ORDER — LIDOCAINE-EPINEPHRINE (PF) 1 %-1:200000 IJ SOLN
INTRAMUSCULAR | Status: AC
  Filled 2016-08-19: qty 30

## 2016-08-19 MED ORDER — METHYLDOPA 250 MG PO TABS
250.0000 mg | ORAL_TABLET | Freq: Two times a day (BID) | ORAL | Status: DC
  Administered 2016-08-20: 250 mg via ORAL
  Filled 2016-08-19 (×2): qty 1

## 2016-08-19 MED ORDER — KETOROLAC TROMETHAMINE 30 MG/ML IJ SOLN
30.0000 mg | Freq: Four times a day (QID) | INTRAMUSCULAR | Status: AC | PRN
  Administered 2016-08-19 – 2016-08-20 (×4): 30 mg via INTRAVENOUS
  Filled 2016-08-19 (×4): qty 1

## 2016-08-19 MED ORDER — FENTANYL CITRATE (PF) 100 MCG/2ML IJ SOLN
INTRAMUSCULAR | Status: AC
  Administered 2016-08-19: 25 ug via INTRAVENOUS
  Filled 2016-08-19: qty 2

## 2016-08-19 MED ORDER — PROPOFOL 10 MG/ML IV BOLUS
INTRAVENOUS | Status: DC | PRN
  Administered 2016-08-19: 160 mg via INTRAVENOUS

## 2016-08-19 MED ORDER — LACTATED RINGERS IV SOLN: INTRAVENOUS | Status: DC

## 2016-08-19 MED ORDER — SIMETHICONE 80 MG PO CHEW
80.0000 mg | CHEWABLE_TABLET | Freq: Four times a day (QID) | ORAL | Status: DC | PRN
Start: 2016-08-19 — End: 2016-08-20

## 2016-08-19 MED ORDER — FENTANYL CITRATE (PF) 100 MCG/2ML IJ SOLN
INTRAMUSCULAR | Status: DC | PRN
  Administered 2016-08-19 (×5): 50 ug via INTRAVENOUS
  Administered 2016-08-19: 100 ug via INTRAVENOUS

## 2016-08-19 MED ORDER — FLUORESCEIN SODIUM 10 % IV SOLN
INTRAVENOUS | Status: DC | PRN
  Administered 2016-08-19: 50 mg via INTRAVENOUS

## 2016-08-19 MED ORDER — FAMOTIDINE 20 MG PO TABS
ORAL_TABLET | ORAL | Status: AC
  Filled 2016-08-19: qty 1

## 2016-08-19 MED ORDER — ROCURONIUM BROMIDE 100 MG/10ML IV SOLN
INTRAVENOUS | Status: DC | PRN
  Administered 2016-08-19 (×2): 10 mg via INTRAVENOUS
  Administered 2016-08-19: 50 mg via INTRAVENOUS
  Administered 2016-08-19: 20 mg via INTRAVENOUS

## 2016-08-19 MED ORDER — HEMOSTATIC AGENTS (NO CHARGE) OPTIME
TOPICAL | Status: DC | PRN
  Administered 2016-08-19: 1 via TOPICAL

## 2016-08-19 MED ORDER — FENTANYL CITRATE (PF) 100 MCG/2ML IJ SOLN
25.0000 ug | INTRAMUSCULAR | Status: AC | PRN
  Administered 2016-08-19 (×6): 25 ug via INTRAVENOUS

## 2016-08-19 MED ORDER — LACTATED RINGERS IV SOLN
INTRAVENOUS | Status: DC
  Administered 2016-08-19 (×3): via INTRAVENOUS

## 2016-08-19 MED ORDER — FAMOTIDINE 20 MG PO TABS
20.0000 mg | ORAL_TABLET | Freq: Once | ORAL | Status: AC
  Administered 2016-08-19: 20 mg via ORAL

## 2016-08-19 MED ORDER — SUGAMMADEX SODIUM 500 MG/5ML IV SOLN
INTRAVENOUS | Status: DC | PRN
  Administered 2016-08-19: 200 mg via INTRAVENOUS

## 2016-08-19 MED ORDER — SCOPOLAMINE 1 MG/3DAYS TD PT72
1.0000 | MEDICATED_PATCH | Freq: Once | TRANSDERMAL | Status: DC
  Administered 2016-08-19: 1.5 mg via TRANSDERMAL

## 2016-08-19 MED ORDER — SCOPOLAMINE 1 MG/3DAYS TD PT72
MEDICATED_PATCH | TRANSDERMAL | Status: AC
  Filled 2016-08-19: qty 1

## 2016-08-19 MED ORDER — LIDOCAINE-EPINEPHRINE (PF) 1 %-1:200000 IJ SOLN
INTRAMUSCULAR | Status: DC | PRN
  Administered 2016-08-19: 9 mL

## 2016-08-19 MED ORDER — MORPHINE SULFATE (PF) 2 MG/ML IV SOLN
1.0000 mg | INTRAVENOUS | Status: DC | PRN
  Administered 2016-08-19 (×3): 2 mg via INTRAVENOUS
  Filled 2016-08-19 (×3): qty 1

## 2016-08-19 MED ORDER — CITALOPRAM HYDROBROMIDE 20 MG PO TABS
20.0000 mg | ORAL_TABLET | Freq: Every day | ORAL | Status: DC
Start: 2016-08-19 — End: 2016-08-20
  Administered 2016-08-20: 20 mg via ORAL
  Filled 2016-08-19 (×2): qty 1

## 2016-08-19 MED ORDER — ONDANSETRON HCL 4 MG/2ML IJ SOLN
INTRAMUSCULAR | Status: DC | PRN
  Administered 2016-08-19: 4 mg via INTRAVENOUS

## 2016-08-19 MED ORDER — MIDAZOLAM HCL 2 MG/2ML IJ SOLN
INTRAMUSCULAR | Status: DC | PRN
  Administered 2016-08-19: 2 mg via INTRAVENOUS

## 2016-08-19 MED ORDER — FLEET ENEMA 7-19 GM/118ML RE ENEM: 1.0000 | ENEMA | Freq: Once | RECTAL | Status: DC

## 2016-08-19 MED ORDER — ONDANSETRON HCL 4 MG/2ML IJ SOLN
4.0000 mg | Freq: Four times a day (QID) | INTRAMUSCULAR | Status: DC | PRN
  Administered 2016-08-19: 4 mg via INTRAVENOUS
  Filled 2016-08-19: qty 2

## 2016-08-19 MED ORDER — LIDOCAINE HCL (CARDIAC) 20 MG/ML IV SOLN
INTRAVENOUS | Status: DC | PRN
  Administered 2016-08-19: 100 mg via INTRAVENOUS

## 2016-08-19 MED ORDER — CEFOXITIN SODIUM-DEXTROSE 2-2.2 GM-% IV SOLR (PREMIX): 2.0000 g | Freq: Once | INTRAVENOUS | Status: DC

## 2016-08-19 SURGICAL SUPPLY — 54 items
BAG URO DRAIN 2000ML W/SPOUT (MISCELLANEOUS) ×5 IMPLANT
BENZOIN TINCTURE PRP APPL 2/3 (GAUZE/BANDAGES/DRESSINGS) ×5 IMPLANT
BLADE SURG SZ11 CARB STEEL (BLADE) ×5 IMPLANT
CANISTER SUCT 1200ML W/VALVE (MISCELLANEOUS) ×5 IMPLANT
CATH ROBINSON RED A/P 16FR (CATHETERS) ×5 IMPLANT
CHLORAPREP W/TINT 26ML (MISCELLANEOUS) ×5 IMPLANT
CLOSURE WOUND 1/2 X4 (GAUZE/BANDAGES/DRESSINGS) ×1
CLOSURE WOUND 1/4X4 (GAUZE/BANDAGES/DRESSINGS) ×1
DERMABOND ADVANCED (GAUZE/BANDAGES/DRESSINGS) ×2
DERMABOND ADVANCED .7 DNX12 (GAUZE/BANDAGES/DRESSINGS) ×3 IMPLANT
DRSG TEGADERM 2-3/8X2-3/4 SM (GAUZE/BANDAGES/DRESSINGS) ×5 IMPLANT
ENDOPOUCH RETRIEVER 10 (MISCELLANEOUS) ×5 IMPLANT
GAUZE SPONGE NON-WVN 2X2 STRL (MISCELLANEOUS) ×3 IMPLANT
GLOVE BIO SURGEON STRL SZ8 (GLOVE) ×10 IMPLANT
GLOVE PROTEXIS LATEX SZ 7.5 (GLOVE) ×5 IMPLANT
GLOVE SKINSENSE NS SZ8.0 LF (GLOVE) ×4
GLOVE SKINSENSE STRL SZ8.0 LF (GLOVE) ×6 IMPLANT
GOWN STRL REUS W/ TWL LRG LVL3 (GOWN DISPOSABLE) ×6 IMPLANT
GOWN STRL REUS W/ TWL XL LVL3 (GOWN DISPOSABLE) ×3 IMPLANT
GOWN STRL REUS W/TWL LRG LVL3 (GOWN DISPOSABLE) ×4
GOWN STRL REUS W/TWL XL LVL3 (GOWN DISPOSABLE) ×2
HANDLE YANKAUER SUCT BULB TIP (MISCELLANEOUS) ×5 IMPLANT
IRRIGATION STRYKERFLOW (MISCELLANEOUS) ×3 IMPLANT
IRRIGATOR STRYKERFLOW (MISCELLANEOUS) ×5
IV NS 1000ML (IV SOLUTION) ×2
IV NS 1000ML BAXH (IV SOLUTION) ×3 IMPLANT
IV SOD CHL 0.9% 1000ML (IV SOLUTION) ×5 IMPLANT
KIT RM TURNOVER CYSTO AR (KITS) ×5 IMPLANT
LABEL OR SOLS (LABEL) ×5 IMPLANT
NEEDLE HYPO 22GX1.5 SAFETY (NEEDLE) ×5 IMPLANT
NS IRRIG 500ML POUR BTL (IV SOLUTION) ×5 IMPLANT
PACK GYN LAPAROSCOPIC (MISCELLANEOUS) ×5 IMPLANT
PAD OB MATERNITY 4.3X12.25 (PERSONAL CARE ITEMS) ×5 IMPLANT
PAD PREP 24X41 OB/GYN DISP (PERSONAL CARE ITEMS) ×5 IMPLANT
SET CYSTO W/LG BORE CLAMP LF (SET/KITS/TRAYS/PACK) ×5 IMPLANT
SHEARS HARMONIC ACE PLUS 36CM (ENDOMECHANICALS) ×5 IMPLANT
SPONGE LAP 18X18 5 PK (GAUZE/BANDAGES/DRESSINGS) ×5 IMPLANT
SPONGE VERSALON 2X2 STRL (MISCELLANEOUS) ×2
SPONGE XRAY 4X4 16PLY STRL (MISCELLANEOUS) ×5 IMPLANT
STRIP CLOSURE SKIN 1/2X4 (GAUZE/BANDAGES/DRESSINGS) ×4 IMPLANT
STRIP CLOSURE SKIN 1/4X4 (GAUZE/BANDAGES/DRESSINGS) ×4 IMPLANT
SUT VIC AB 0 CT1 36 (SUTURE) ×5 IMPLANT
SUT VIC AB 2-0 UR6 27 (SUTURE) ×5 IMPLANT
SUT VIC AB 4-0 SH 27 (SUTURE) ×2
SUT VIC AB 4-0 SH 27XANBCTRL (SUTURE) ×3 IMPLANT
SWABSTK COMLB BENZOIN TINCTURE (MISCELLANEOUS) ×5 IMPLANT
SYRINGE 10CC LL (SYRINGE) ×5 IMPLANT
TIP RIGID 35CM EVICEL (HEMOSTASIS) ×5 IMPLANT
TROCAR ENDO BLADELESS 11MM (ENDOMECHANICALS) ×5 IMPLANT
TROCAR XCEL NON-BLD 5MMX100MML (ENDOMECHANICALS) ×5 IMPLANT
TROCAR XCEL UNIV SLVE 11M 100M (ENDOMECHANICALS) ×5 IMPLANT
TUBING CONNECTING 10 (TUBING) ×4 IMPLANT
TUBING CONNECTING 10' (TUBING) ×1
TUBING INSUFFLATOR HI FLOW (MISCELLANEOUS) ×5 IMPLANT

## 2016-08-19 NOTE — Anesthesia Preprocedure Evaluation (Signed)
Anesthesia Evaluation  Patient identified by MRN, date of birth, ID band Patient awake    Reviewed: Allergy & Precautions, NPO status , Patient's Chart, lab work & pertinent test results  History of Anesthesia Complications (+) PONV and Emergence Delirium  Airway Mallampati: III       Dental   Pulmonary neg pulmonary ROS,           Cardiovascular hypertension, Pt. on medications      Neuro/Psych Depression    GI/Hepatic negative GI ROS, Neg liver ROS,   Endo/Other  negative endocrine ROS  Renal/GU negative Renal ROS     Musculoskeletal   Abdominal   Peds  Hematology  (+) anemia ,   Anesthesia Other Findings   Reproductive/Obstetrics                             Anesthesia Physical Anesthesia Plan  ASA: II  Anesthesia Plan: General   Post-op Pain Management:    Induction: Intravenous  Airway Management Planned: Oral ETT  Additional Equipment:   Intra-op Plan:   Post-operative Plan:   Informed Consent: I have reviewed the patients History and Physical, chart, labs and discussed the procedure including the risks, benefits and alternatives for the proposed anesthesia with the patient or authorized representative who has indicated his/her understanding and acceptance.     Plan Discussed with:   Anesthesia Plan Comments:         Anesthesia Quick Evaluation

## 2016-08-19 NOTE — OR Nursing (Signed)
Dr. Feliberto GottronSchermerhorn aware re med allergies, advises ok to admin. Mefoxin

## 2016-08-19 NOTE — Op Note (Signed)
Catherine Holt:  Catherine Holt, Catherine Holt                 ACCOUNT NO.:  [id number]  MEDICAL RECORD NO.:  [id number]  LOCATION:  348A                         FACILITY:  [facility]  PHYSICIAN:  Jennell Cornerhomas Sharyn Brilliant, MDDATE OF BIRTH:  02-26-1981  DATE OF PROCEDURE:  08/19/2016 DATE OF DISCHARGE:                              OPERATIVE REPORT   PREOPERATIVE DIAGNOSIS:  Chronic pelvic pain.  POSTOPERATIVE DIAGNOSIS:  Chronic pelvic pain.  PROCEDURE PERFORMED: 1. Laparoscopic-assisted vaginal hysterectomy with bilateral     salpingectomy. 2. Cystoscopy.  SURGEON:  Jennell Cornerhomas Tierra Thoma, MD  ANESTHESIA:  General endotracheal anesthesia.  FIRST ASSISTANT:  Chelsea Ward, MD.  INDICATION:  A 35 year old, gravida 2, para 2 patient with a long history of left lower quadrant pain and sciatica.  Patient had a previous diagnostic laparoscopy approximately 10 years ago where there was thought to be possible endometriosis causing the pain.  No pathologic specimen obtained at that time.  The patient has continued pain on the left side and left sciatica which improved during her most recent pregnancy and then has returned and is debilitating.  DESCRIPTION OF PROCEDURE:  After adequate general endotracheal anesthesia, patient was placed in dorsal supine position.  Patient's abdomen, perineum, and vagina were prepped and draped in normal sterile fashion.  Time-out was performed.  The patient did receive 2 g IV cefoxitin prior to commencement of the case.  The patient's bladder was drained, yielding 100 mL urine.  A speculum was placed into the vagina and the anterior cervix was grasped with a single-tooth tenaculum and a Kahn cannula was placed into the endocervical canal to be used for uterine manipulation during the laparoscopic portion of the procedure. Gloves were changed.  Attention was then directed to the patient's abdomen.  A 15 mm infraumbilical incision was made after injecting with 0.5% Marcaine and the Optiview  cannula with laparoscope were advanced into the abdominal cavity under direct visualization.  The patient's abdomen was insufflated.  Left lower port site was placed 3 cm medial to the left anterior iliac spine.  Port was placed under direct visualization.  Initial impression of the patient's abdomen revealed no evidence of endometriosis.  No scar tissue.  Uterus approximately 12 weeks in size, bulbous, and globular.  Left ovary appeared normal.  No Allen-Master windows.  There were only a few filmy adhesions of the anterior abdominal wall due to prior cholecystectomy.  Third port site was placed in the right lower quadrant again 3 cm medial to the right anterior iliac spine.  Attention was directed to the patient's left fallopian tube which was grasped with the fimbriated end using the Harmonic scalpel.  The mesosalpinx was cauterized, transected, and once the fallopian tube was freed to the cornual section of the tube, the left round ligament was then clamped, cauterized, transected with the Harmonic scalpel.  Utero-ovarian ligament was also transected and sequential bites of the broad ligament ensued.  Anterior leaf of the peritoneum was opened with the Harmonic scalpel and the left uterine artery was identified and cauterized with the Kleppinger and transected. A similar procedure was repeated on the patient's right side, again after grasping the fimbriated end of the fallopian tube.  The mesosalpinx  was cauterized and transected.  The round ligament on the right was opened and the utero-ovarian ligament was cauterized and transected and the anterior leaf of the peritoneum was open to communicate with the previously dissected area on the left.  The patient's right uterine artery was identified, cauterized, and transected at the cornual area.  Attention was then directed vaginally where the cervix was circumferentially injected with 1% lidocaine with 1:200,000 epinephrine.  A direct  posterior colpotomy incision was made and entry into the posterior cul-de-sac was accomplished without difficulty.  The uterosacral ligaments were bilaterally clamped, transected, suture ligated with 0 Vicryl suture, they were tagged for later identification.  The anterior cul-de-sac was entered sharply without difficulty.  While proceeding on the patient's right broad ligament, there was some bleeding that surgeon encountered which prompted an additional view with the laparoscope.  There was some bleeding coming from the vessels lateral to the uterine artery, they were cauterized with good hemostasis noted.  Attention was then directed vaginally again and the cervix, uterus, and fallopian tubes were removed.  Good hemostasis was noted.  At this point, the vaginal cuff was closed in a running fashion with 0 Vicryl suture.  Good hemostasis was noted.  Attention was then directed back via the laparoscope and the previously mentioned vessels on the right were still oozing and additional cautery was used to control the bleeding.  Small amount of oozing from the perirectal fat which required Evicel solution in the deep part of the pelvis.  Ultimately, good hemostasis resulted.  The pressure was lowered to 7 mmHg and good hemostasis was again identified. The patient's abdomen was deflated and the infraumbilical fascia was closed with a 2-0 Vicryl suture and all the skin incisions were closed with interrupted 4-0 Vicryl suture.  LiquiBand was placed over each of the incision sites.  Given the amount of cautery to the right sidewall vessels, surgeon felt prudent to perform cystoscopy to ensure adequate efflux from the ureters.  A 30 degree scope was advanced into the bladder and lactated Ringer's were used to fill the bladder.  Each ureter was identified and there was normal efflux of urine coming from the fallopian tubes.  Foley catheter was then placed and the procedure was  terminated.  ESTIMATED BLOOD LOSS:  400 mL.  INTRAOPERATIVE FLUIDS:  1600 mL.  URINE OUTPUT:  100 mL.  The patient tolerated the procedure well and was taken to recovery room in good condition.          ______________________________ Jennell Cornerhomas Kaladin Noseworthy, MD     TS/MEDQ  D:  08/19/2016  T:  08/19/2016  Job:  [id number]

## 2016-08-19 NOTE — Anesthesia Procedure Notes (Signed)
Procedure Name: Intubation Date/Time: 08/19/2016 7:43 AM Performed by: Michaele OfferSAVAGE, Alakai Macbride Pre-anesthesia Checklist: Patient identified, Emergency Drugs available, Suction available, Patient being monitored and Timeout performed Patient Re-evaluated:Patient Re-evaluated prior to inductionOxygen Delivery Method: Circle system utilized Preoxygenation: Pre-oxygenation with 100% oxygen Intubation Type: IV induction Ventilation: Mask ventilation without difficulty Laryngoscope Size: Mac and 3 Grade View: Grade I Tube type: Oral Tube size: 7.0 mm Number of attempts: 1 Airway Equipment and Method: Rigid stylet Placement Confirmation: ETT inserted through vocal cords under direct vision,  positive ETCO2 and breath sounds checked- equal and bilateral Secured at: 20 cm Tube secured with: Tape Dental Injury: Teeth and Oropharynx as per pre-operative assessment

## 2016-08-19 NOTE — Transfer of Care (Signed)
Immediate Anesthesia Transfer of Care Note  Patient: Catherine Holt  Procedure(s) Performed: Procedure(s): LAPAROSCOPIC ASSISTED VAGINAL HYSTERECTOMY WITH SALPINGECTOMY (Bilateral) CYSTOSCOPY (N/A)  Patient Location: PACU  Anesthesia Type:General  Level of Consciousness: awake, alert , oriented and patient cooperative  Airway & Oxygen Therapy: Patient Spontanous Breathing and Patient connected to face mask oxygen  Post-op Assessment: Report given to RN, Post -op Vital signs reviewed and stable and Patient moving all extremities X 4  Post vital signs: Reviewed and stable  Last Vitals:  Vitals:   08/19/16 0650 08/19/16 1039  BP: (!) 126/91 137/76  Pulse: 90 95  Resp: 16 14  Temp: 36.7 C 37.1 C    Last Pain:  Vitals:   08/19/16 0650  TempSrc: Oral  PainSc: 4          Complications: No apparent anesthesia complications

## 2016-08-19 NOTE — Anesthesia Postprocedure Evaluation (Signed)
Anesthesia Post Note  Patient: Catherine Holt  Procedure(s) Performed: Procedure(s) (LRB): LAPAROSCOPIC ASSISTED VAGINAL HYSTERECTOMY WITH SALPINGECTOMY (Bilateral) CYSTOSCOPY (N/A)  Patient location during evaluation: PACU Anesthesia Type: General Level of consciousness: awake and alert Pain management: pain level controlled Vital Signs Assessment: post-procedure vital signs reviewed and stable Respiratory status: spontaneous breathing and respiratory function stable Cardiovascular status: stable Anesthetic complications: no    Last Vitals:  Vitals:   08/19/16 1054 08/19/16 1109  BP: 132/79 126/83  Pulse: 74 65  Resp: 16 16  Temp:      Last Pain:  Vitals:   08/19/16 1109  TempSrc:   PainSc: 9                  Danyael Alipio K

## 2016-08-19 NOTE — Progress Notes (Signed)
Pt ready for LAVH / bilateral salpingectomy and possible left oophorectomy . NPO . Labs wnl . Neg HCG . Ready to proceed.

## 2016-08-19 NOTE — Brief Op Note (Signed)
08/19/2016  10:27 AM  PATIENT:  Catherine Holt  35 y.o. female  PRE-OPERATIVE DIAGNOSIS:  Chronic Pelvic Pain  Endometriosis  POST-OPERATIVE DIAGNOSIS:  Chronic Pelvic Pain  Endometriosis  PROCEDURE:  Procedure(s): LAPAROSCOPIC ASSISTED VAGINAL HYSTERECTOMY WITH SALPINGECTOMY (Bilateral) CYSTOSCOPY (N/A)  SURGEON:  Surgeon(s) and Role:    Suzy Bouchard* Thomas J Schermerhorn, MD - Primary     PHYSICIAN ASSISTANT: Ranae Plumberhelsea Ward , MD  ASSISTANTS: scrub tech  ANESTHESIA:  GETA EBL:  400 cc IOF 1600 cc OU 100 cc  BLOOD ADMINISTERED:none  DRAINS: Urinary Catheter (Foley)   LOCAL MEDICATIONS USED:  MARCAINE   , lidociane  SPECIMEN:  Source of Specimen:  cervix , uterus , bilateral fallopian tubes   DISPOSITION OF SPECIMEN:  PATHOLOGY  COUNTS:  YES  TOURNIQUET:  * No tourniquets in log *  DICTATION: .Other Dictation: Dictation Number verbal  PLAN OF CARE: Admit for overnight observation  PATIENT DISPOSITION:  PACU - hemodynamically stable.   Delay start of Pharmacological VTE agent (>24hrs) due to surgical blood loss or risk of bleeding: not applicable

## 2016-08-19 NOTE — Progress Notes (Signed)
Patient ID: Catherine Holt, female   DOB: [birth year], 35 y.o.   MRN: [id number] DOS LAVH . Some nausea .  VSS  Good urine output A: stable  P: d/c foley in am  Cont po / IV pain meds

## 2016-08-20 DIAGNOSIS — G8929 Other chronic pain: Secondary | ICD-10-CM | POA: Diagnosis not present

## 2016-08-20 LAB — BASIC METABOLIC PANEL
Anion gap: 6 (ref 5–15)
BUN: 8 mg/dL (ref 6–20)
CHLORIDE: 104 mmol/L (ref 101–111)
CO2: 27 mmol/L (ref 22–32)
CREATININE: 0.64 mg/dL (ref 0.44–1.00)
Calcium: 8.4 mg/dL — ABNORMAL LOW (ref 8.9–10.3)
Glucose, Bld: 95 mg/dL (ref 65–99)
POTASSIUM: 4.1 mmol/L (ref 3.5–5.1)
SODIUM: 137 mmol/L (ref 135–145)

## 2016-08-20 LAB — CBC
HCT: 31.2 % — ABNORMAL LOW (ref 35.0–47.0)
HEMOGLOBIN: 10.4 g/dL — AB (ref 12.0–16.0)
MCH: 27.6 pg (ref 26.0–34.0)
MCHC: 33.2 g/dL (ref 32.0–36.0)
MCV: 83.2 fL (ref 80.0–100.0)
PLATELETS: 318 10*3/uL (ref 150–440)
RBC: 3.76 MIL/uL — AB (ref 3.80–5.20)
RDW: 14.3 % (ref 11.5–14.5)
WBC: 10 10*3/uL (ref 3.6–11.0)

## 2016-08-20 MED ORDER — IBUPROFEN 200 MG PO TABS: 800.0000 mg | ORAL_TABLET | Freq: Three times a day (TID) | ORAL | 1 refills | Status: AC | PRN

## 2016-08-20 MED ORDER — DOCUSATE SODIUM 100 MG PO CAPS
100.0000 mg | ORAL_CAPSULE | Freq: Two times a day (BID) | ORAL | 0 refills | Status: AC
Start: 2016-08-20 — End: ?

## 2016-08-20 MED ORDER — INFLUENZA VAC SPLIT QUAD 0.5 ML IM SUSY
0.5000 mL | PREFILLED_SYRINGE | INTRAMUSCULAR | Status: AC
  Administered 2016-08-20: 0.5 mL via INTRAMUSCULAR
  Filled 2016-08-20 (×2): qty 0.5

## 2016-08-20 MED ORDER — OXYCODONE-ACETAMINOPHEN 5-325 MG PO TABS: 1.0000 | ORAL_TABLET | ORAL | 0 refills | Status: AC | PRN

## 2016-08-20 NOTE — Discharge Summary (Signed)
Physician Discharge Summary  Patient ID: Catherine Holt MRN: [id number] DOB/AGE: 35/11/1980 35 y.o.  Admit date: 08/19/2016 Discharge date: 08/20/2016  Admission Diagnoses:left pelvic pain , chronic  Discharge Diagnoses: chronic pelvic pain , no evidence of endometriosis Active Problems:   Post-operative state   Discharged Condition: good  Hospital Course:  Underwent an uncomplicated  LAVH + bilateral salpingectomy   Consults: None  Significant Diagnostic Studies: labs: pod#1 hct 31%, bun/ cr 8/0.6   Treatments: surgery: as above  Discharge Exam: Blood pressure 111/61, pulse (!) 51, temperature 98.3 F (36.8 C), temperature source Oral, resp. rate 18, height 5\' 4"  (1.626 m), weight 210 lb (95.3 kg), last menstrual period 08/05/2016, SpO2 99 %, unknown if currently breastfeeding. General appearance: alert and cooperative Resp: clear to auscultation bilaterally Cardio: regular rate and rhythm, S1, S2 normal, no murmur, click, rub or gallop GI: soft, non-tender; bowel sounds normal; no masses,  no organomegaly  Disposition: 01-Home or Self Care  Discharge Instructions    Call MD for:  difficulty breathing, headache or visual disturbances    Complete by:  As directed    Call MD for:  extreme fatigue    Complete by:  As directed    Call MD for:  hives    Complete by:  As directed    Call MD for:  persistant dizziness or light-headedness    Complete by:  As directed    Call MD for:  persistant nausea and vomiting    Complete by:  As directed    Call MD for:  redness, tenderness, or signs of infection (pain, swelling, redness, odor or green/yellow discharge around incision site)    Complete by:  As directed    Call MD for:  severe uncontrolled pain    Complete by:  As directed    Call MD for:  temperature >100.4    Complete by:  As directed    Diet - low sodium heart healthy    Complete by:  As directed    Increase activity slowly    Complete by:  As directed         Medication List    STOP taking these medications   aspirin EC 81 MG tablet     TAKE these medications   citalopram 20 MG tablet Commonly known as:  CELEXA Take 20 mg by mouth at bedtime.   cyclobenzaprine 5 MG tablet Commonly known as:  FLEXERIL Take 1 tablet (5 mg total) by mouth every 8 (eight) hours as needed for muscle spasms.   docusate sodium 100 MG capsule Commonly known as:  COLACE Take 1 capsule (100 mg total) by mouth 2 (two) times daily.   ferrous sulfate 325 (65 FE) MG tablet Take 1 tablet (325 mg total) by mouth 2 (two) times daily with a meal. For anemia, take with Vitamin C   FLINTSTONES COMPLETE PO Take 2 tablets by mouth daily.   gabapentin 300 MG capsule Commonly known as:  NEURONTIN Take 1 tab in the morning and 2 tablets at bedtime   ibuprofen 200 MG tablet Commonly known as:  ADVIL Take 4 tablets (800 mg total) by mouth every 8 (eight) hours as needed. What changed:  how much to take  reasons to take this   methyldopa 250 MG tablet Commonly known as:  ALDOMET Take 250 mg by mouth 2 (two) times daily.   oxyCODONE-acetaminophen 5-325 MG tablet Commonly known as:  PERCOCET/ROXICET Take 1-2 tablets by mouth every 4 (four) hours as  needed (moderate to severe pain (when tolerating fluids)).        Signed: SCHERMERHORN,THOMAS 08/20/2016, 9:18 AM

## 2016-08-20 NOTE — Progress Notes (Signed)
Patient discharged home with significant other. Discharge instructions, prescriptions and follow up appointment given to and reviewed with patient and significant other. Patient verbalized understanding. Escorted out via wheelchair by [employer].

## 2016-08-21 LAB — SURGICAL PATHOLOGY

## 2017-01-21 ENCOUNTER — Emergency Department
Admission: EM | Admit: 2017-01-21 | Discharge: 2017-01-21 | Disposition: A | Discharge status: Home / Self Care | Payer: Managed Care, Other (non HMO) | Attending: Emergency Medicine | Admitting: Emergency Medicine

## 2017-01-21 ENCOUNTER — Encounter: Payer: Self-pay | Admitting: Emergency Medicine

## 2017-01-21 DIAGNOSIS — R51 Headache: Secondary | ICD-10-CM | POA: Diagnosis present

## 2017-01-21 DIAGNOSIS — Z79899 Other long term (current) drug therapy: Secondary | ICD-10-CM | POA: Insufficient documentation

## 2017-01-21 DIAGNOSIS — G44209 Tension-type headache, unspecified, not intractable: Secondary | ICD-10-CM

## 2017-01-21 DIAGNOSIS — R42 Dizziness and giddiness: Secondary | ICD-10-CM

## 2017-01-21 DIAGNOSIS — Z791 Long term (current) use of non-steroidal anti-inflammatories (NSAID): Secondary | ICD-10-CM | POA: Insufficient documentation

## 2017-01-21 DIAGNOSIS — M79602 Pain in left arm: Secondary | ICD-10-CM | POA: Insufficient documentation

## 2017-01-21 LAB — URINALYSIS, COMPLETE (UACMP) WITH MICROSCOPIC
BACTERIA UA: NONE SEEN
BILIRUBIN URINE: NEGATIVE
GLUCOSE, UA: NEGATIVE mg/dL
Hgb urine dipstick: NEGATIVE
KETONES UR: 5 mg/dL — AB
LEUKOCYTES UA: NEGATIVE
NITRITE: NEGATIVE
PH: 5 (ref 5.0–8.0)
Protein, ur: NEGATIVE mg/dL
SPECIFIC GRAVITY, URINE: 1.02 (ref 1.005–1.030)

## 2017-01-21 LAB — BASIC METABOLIC PANEL
ANION GAP: 6 (ref 5–15)
BUN: 13 mg/dL (ref 6–20)
CALCIUM: 9 mg/dL (ref 8.9–10.3)
CO2: 27 mmol/L (ref 22–32)
Chloride: 103 mmol/L (ref 101–111)
Creatinine, Ser: 0.73 mg/dL (ref 0.44–1.00)
GLUCOSE: 94 mg/dL (ref 65–99)
POTASSIUM: 3.9 mmol/L (ref 3.5–5.1)
Sodium: 136 mmol/L (ref 135–145)

## 2017-01-21 LAB — CBC
HEMATOCRIT: 40.3 % (ref 35.0–47.0)
HEMOGLOBIN: 13.1 g/dL (ref 12.0–16.0)
MCH: 26.8 pg (ref 26.0–34.0)
MCHC: 32.6 g/dL (ref 32.0–36.0)
MCV: 82.3 fL (ref 80.0–100.0)
Platelets: 398 10*3/uL (ref 150–440)
RBC: 4.9 MIL/uL (ref 3.80–5.20)
RDW: 15.5 % — ABNORMAL HIGH (ref 11.5–14.5)
WBC: 5.9 10*3/uL (ref 3.6–11.0)

## 2017-01-21 MED ORDER — DIAZEPAM 2 MG PO TABS: 2.0000 mg | ORAL_TABLET | Freq: Three times a day (TID) | ORAL | 0 refills | Status: AC | PRN

## 2017-01-21 NOTE — ED Triage Notes (Signed)
Pt to ed with c/o headache, blurred vision, dizziness and weakness since yesterday.

## 2017-01-21 NOTE — ED Provider Notes (Signed)
[facility] Emergency Department Provider Note  ____________________________________________  Time seen: Approximately 3:29 PM  I have reviewed the triage vital signs and the nursing notes.   HISTORY  Chief Complaint Dizziness and Headache    HPI Catherine Holt is a 36 y.o. female patient complains of bilateral generalized headache that starts in the back of the head and radiates up around the scalp to the front. Associated with some blurry vision when sitting at a computer screen as well as some dizziness described as room spinning whenever she stood up from her desk today. She also has some generalized weakness and fatigue. Not sudden onset or thunderclap. Very gradual onset. No fevers chills or vomiting. No neck stiffness. No pain with rotation of the head or neck. No numbness tingling or weakness. She does report that she doesn't have some pain in her left arm when she tries to abduct it and this makes her feel like she has a hard time holding her 65-month-old child as well as a hurts.     Past Medical History:  Diagnosis Date  . Anemia   . Depression   . Endometriosis   . Headache    MIGRAINES  . PONV (postoperative nausea and vomiting)   . Pregnancy induced hypertension 2011     Patient Active Problem List   Diagnosis Date Noted  . Post-operative state 08/19/2016  . Chronic hypertension in pregnancy 05/12/2016  . Headache 04/11/2016  . PIH (pregnancy induced hypertension) 03/20/2016  . Advanced maternal age in multigravida 11/20/2015  . Family history of congenital heart defect 11/20/2015  . Family history of osteogenesis imperfecta 11/20/2015  . H/O: depression 05/26/2015  . Left leg pain 04/12/2015  . Leg weakness 04/12/2015  . Leg paresthesia 04/12/2015  . Neuralgia neuritis, sciatic nerve 03/28/2015  . Endometriosis of pelvic peritoneum 10/10/2014     Past Surgical History:  Procedure Laterality Date  . CHOLECYSTECTOMY    .  CYSTOSCOPY N/A 08/19/2016   Procedure: CYSTOSCOPY;  Surgeon: Suzy Bouchard, MD;  Location: [facility] ORS;  Service: Gynecology;  Laterality: N/A;  . DIAGNOSTIC LAPAROSCOPY     X 2  . LAPAROSCOPIC VAGINAL HYSTERECTOMY WITH SALPINGECTOMY Bilateral 08/19/2016   Procedure: LAPAROSCOPIC ASSISTED VAGINAL HYSTERECTOMY WITH SALPINGECTOMY;  Surgeon: Suzy Bouchard, MD;  Location: [facility] ORS;  Service: Gynecology;  Laterality: Bilateral;     Prior to Admission medications   Medication Sig Start Date End Date Taking? Authorizing Provider  citalopram (CELEXA) 20 MG tablet Take 20 mg by mouth at bedtime. 07/04/16   Historical Provider, MD  cyclobenzaprine (FLEXERIL) 5 MG tablet Take 1 tablet (5 mg total) by mouth every 8 (eight) hours as needed for muscle spasms. 07/07/15   Donika K Patel, DO  diazepam (VALIUM) 2 MG tablet Take 1 tablet (2 mg total) by mouth every 8 (eight) hours as needed for muscle spasms. 01/21/17 01/21/18  Sharman Cheek, MD  docusate sodium (COLACE) 100 MG capsule Take 1 capsule (100 mg total) by mouth 2 (two) times daily. 08/20/16   Ihor Austin Schermerhorn, MD  ferrous sulfate 325 (65 FE) MG tablet Take 1 tablet (325 mg total) by mouth 2 (two) times daily with a meal. For anemia, take with Vitamin C Patient not taking: Reported on 08/08/2016 05/15/16 07/14/16  Christeen Douglas, MD  gabapentin (NEURONTIN) 300 MG capsule Take 1 tab in the morning and 2 tablets at bedtime Patient not taking: Reported on 11/20/2015 05/26/15   Roxana Hires K Patel, DO  ibuprofen (ADVIL) 200  MG tablet Take 4 tablets (800 mg total) by mouth every 8 (eight) hours as needed. 08/20/16   Ihor Austin Schermerhorn, MD  methyldopa (ALDOMET) 250 MG tablet Take 250 mg by mouth 2 (two) times daily. 02/13/16   Historical Provider, MD  oxyCODONE-acetaminophen (PERCOCET/ROXICET) 5-325 MG tablet Take 1-2 tablets by mouth every 4 (four) hours as needed (moderate to severe pain (when tolerating fluids)). 08/20/16   Suzy Bouchard,  MD  Pediatric Multivit-Minerals-C [facility] COMPLETE PO) Take 2 tablets by mouth daily.    Historical Provider, MD     Allergies Ambien [zolpidem tartrate]; Doxycycline monohydrate; Latex; and Penicillins   Family History  Problem Relation Age of Onset  . Diabetes Mother   . Depression Father   . Cancer Maternal Uncle   . Hypertension Maternal Uncle   . Asthma Maternal Uncle   . Heart disease Maternal Grandmother   . Emphysema Maternal Grandmother   . Heart disease Maternal Grandfather   . Cancer Maternal Grandfather   . Heart murmur Brother   . Fibroids Sister   . Depression Brother   . Depression Sister   . Healthy Son   . Cancer Paternal Aunt     Social History Social History  Substance Use Topics  . Smoking status: Never Smoker  . Smokeless tobacco: Never Used  . Alcohol use No    Review of Systems  Constitutional:   No fever or chills.  ENT:   No sore throat. No rhinorrhea. Cardiovascular:   No chest pain. Respiratory:   No dyspnea or cough. Gastrointestinal:   Negative for abdominal pain, vomiting and diarrhea.  Genitourinary:   Negative for dysuria or difficulty urinating. Musculoskeletal:   Negative for focal pain or swelling Neurological:   Positive as above for headaches 10-point ROS otherwise negative.  ____________________________________________   PHYSICAL EXAM:  VITAL SIGNS: ED Triage Vitals  Enc Vitals Group     BP 01/21/17 1235 (!) 153/93     Pulse Rate 01/21/17 1235 (!) 58     Resp 01/21/17 1235 18     Temp 01/21/17 1235 98.2 F (36.8 C)     Temp Source 01/21/17 1235 Oral     SpO2 01/21/17 1235 100 %     Weight 01/21/17 1237 210 lb (95.3 kg)     Height --      Head Circumference --      Peak Flow --      Pain Score 01/21/17 1420 4     Pain Loc --      Pain Edu? --      Excl. in GC? --     Vital signs reviewed, nursing assessments reviewed.   Constitutional:   Alert and oriented. Well appearing and in no distress. Eyes:    No scleral icterus. No conjunctival pallor. PERRL. EOMI.  No nystagmus.Funduscopy normal. Sharp discs. No hemorrhages. ENT   Head:   Normocephalic and atraumatic.   Nose:   No congestion/rhinnorhea. No septal hematoma   Mouth/Throat:   MMM, no pharyngeal erythema. No peritonsillar mass.    Neck:   No stridor. No SubQ emphysema. No meningismus. Hematological/Lymphatic/Immunilogical:   No cervical lymphadenopathy. Cardiovascular:   RRR. Symmetric bilateral radial and DP pulses.  No murmurs.  Respiratory:   Normal respiratory effort without tachypnea nor retractions. Breath sounds are clear and equal bilaterally. No wheezes/rales/rhonchi. Gastrointestinal:   Soft and nontender. Non distended. There is no CVA tenderness.  No rebound, rigidity, or guarding. Genitourinary:   deferred Musculoskeletal:  Normal range of motion in all extremities. No joint effusions.  No lower extremity tenderness.  No edema. Neurologic:   Normal speech and language.  CN 2-10 normal. Motor grossly intact. Pain limited effort on the left upper extremity but with encouragement strength is full. No gross focal neurologic deficits are appreciated.  Skin:    Skin is warm, dry and intact. No rash noted.  No petechiae, purpura, or bullae.  ____________________________________________    LABS (pertinent positives/negatives) (all labs ordered are listed, but only abnormal results are displayed) Labs Reviewed  CBC - Abnormal; Notable for the following:       Result Value   RDW 15.5 (*)    All other components within normal limits  URINALYSIS, COMPLETE (UACMP) WITH MICROSCOPIC - Abnormal; Notable for the following:    Color, Urine YELLOW (*)    APPearance HAZY (*)    Ketones, ur 5 (*)    Squamous Epithelial / LPF 6-30 (*)    All other components within normal limits  BASIC METABOLIC PANEL   ____________________________________________   EKG  Interpreted by me Normal sinus rhythm rate of 62, normal  axis and intervals. Normal QRS ST segments and T waves.  ____________________________________________    RADIOLOGY  No results found.  ____________________________________________   PROCEDURES Procedures  ____________________________________________   INITIAL IMPRESSION / ASSESSMENT AND PLAN / ED COURSE  Pertinent labs & imaging results that were available during my care of the patient were reviewed by me and considered in my medical decision making (see chart for details).  Patient well appearing no acute distress, presents with a constellation of symptoms highly consistent with tension headache versus migraine type headache.Considering the patient's symptoms, medical history, and physical examination today, I have low suspicion for ischemic stroke, intracranial hemorrhage, meningitis, encephalitis, carotid or vertebral dissection, venous sinus thrombosis, MS, intracranial hypertension, glaucoma, CRAO, CRVO, or temporal arteritis. Patient agrees she thought this was a tension headache. She is otherwise well-appearing. She also seems to have some muscular skeletal pain of the left upper extremity related to probably lifting her 64-month-old child repeatedly at home.         ____________________________________________   FINAL CLINICAL IMPRESSION(S) / ED DIAGNOSES  Final diagnoses:  Acute non intractable tension-type headache  Dizziness      New Prescriptions   DIAZEPAM (VALIUM) 2 MG TABLET    Take 1 tablet (2 mg total) by mouth every 8 (eight) hours as needed for muscle spasms.     Portions of this note were generated with dragon dictation software. Dictation errors may occur despite best attempts at proofreading.    Sharman Cheek, MD 01/21/17 1534

## 2017-02-18 ENCOUNTER — Other Ambulatory Visit (INDEPENDENT_AMBULATORY_CARE_PROVIDER_SITE_OTHER): Payer: Self-pay | Admitting: Internal Medicine

## 2017-02-18 ENCOUNTER — Ambulatory Visit (INDEPENDENT_AMBULATORY_CARE_PROVIDER_SITE_OTHER): Payer: Managed Care, Other (non HMO)

## 2017-02-18 DIAGNOSIS — I10 Essential (primary) hypertension: Secondary | ICD-10-CM

## 2017-11-15 IMAGING — US US OB LIMITED
1 series · 14 of 14 positions shown · non-contrast
Comparison: none

CLINICAL DATA: 35 weeks pregnant. Chronic hypertension. Evaluate
amniotic fluid.

EXAM:
LIMITED OBSTETRIC ULTRASOUND

[Series 1: us ob limited · 0.26mm/px · 14 of 14 slices shown]
[im 1/14]
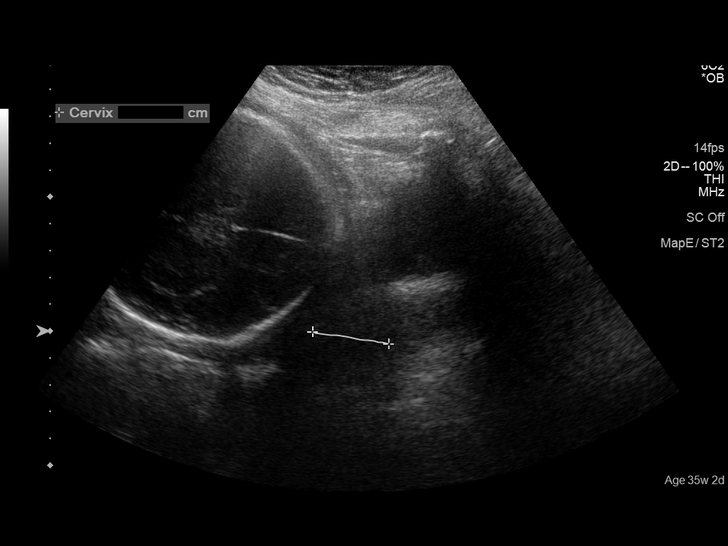
[im 2/14]
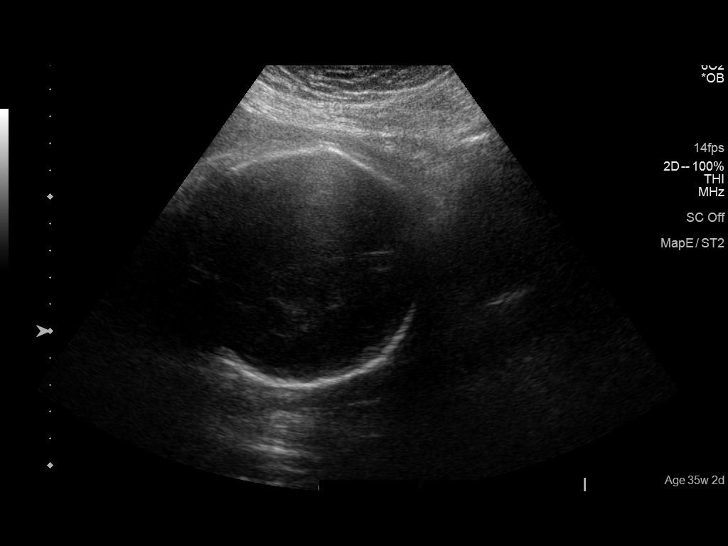
[im 3/14]
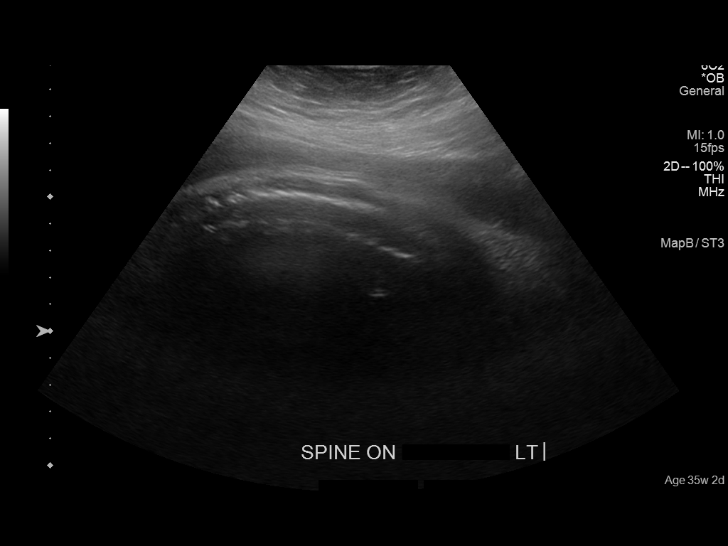
[im 4/14]
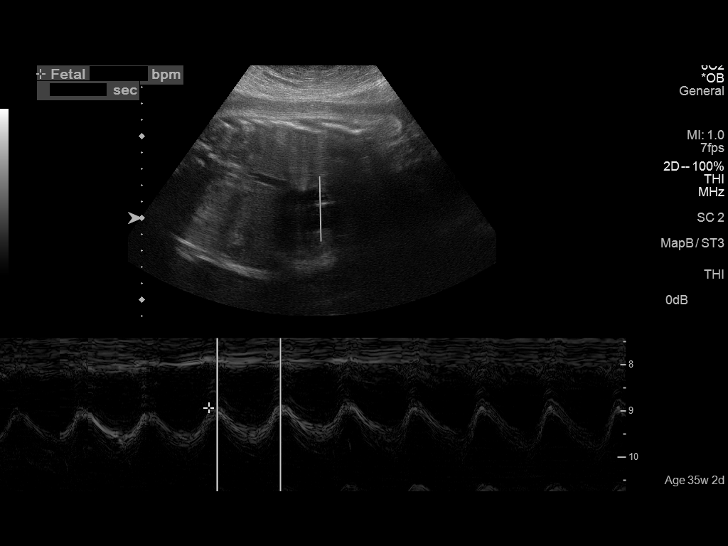
[im 5/14]
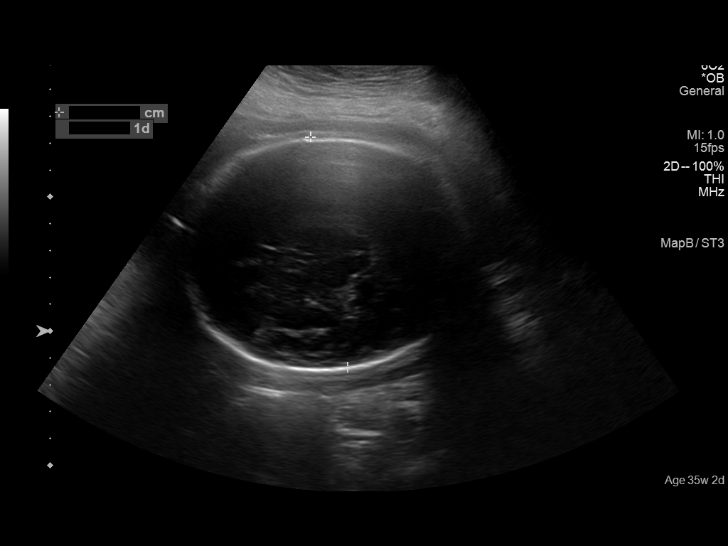
[im 6/14]
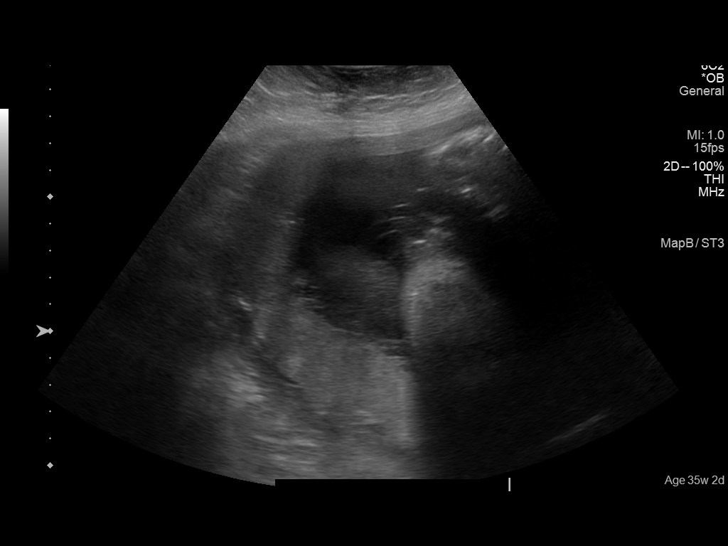
[im 7/14]
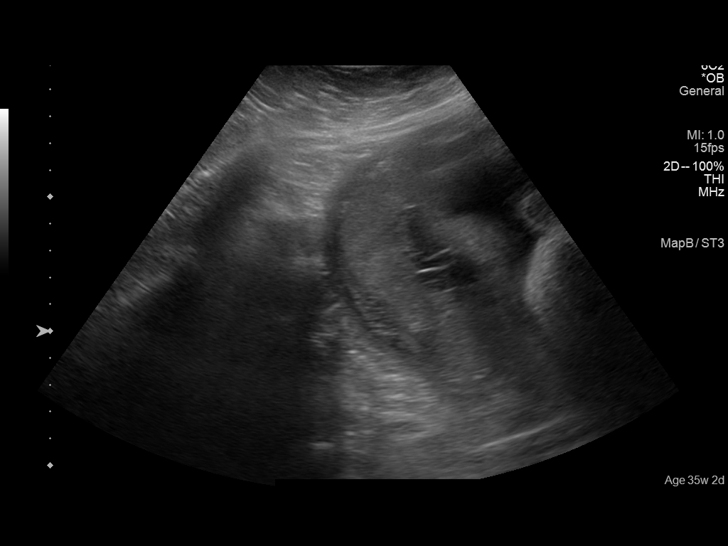
[im 8/14]
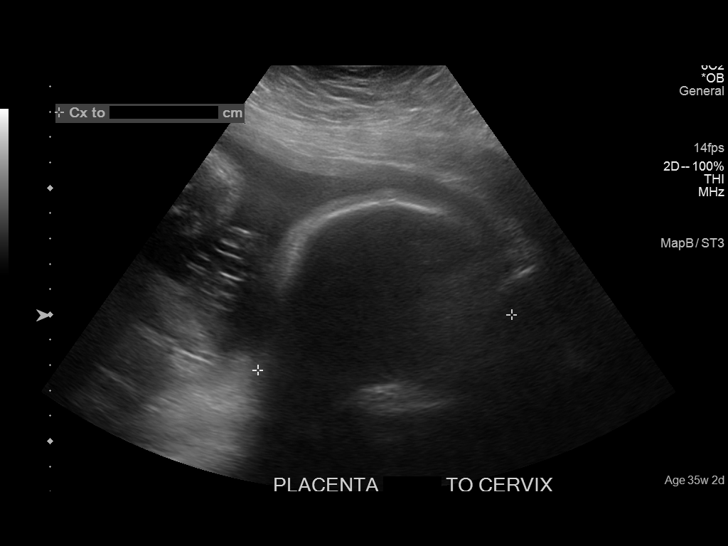
[im 9/14]
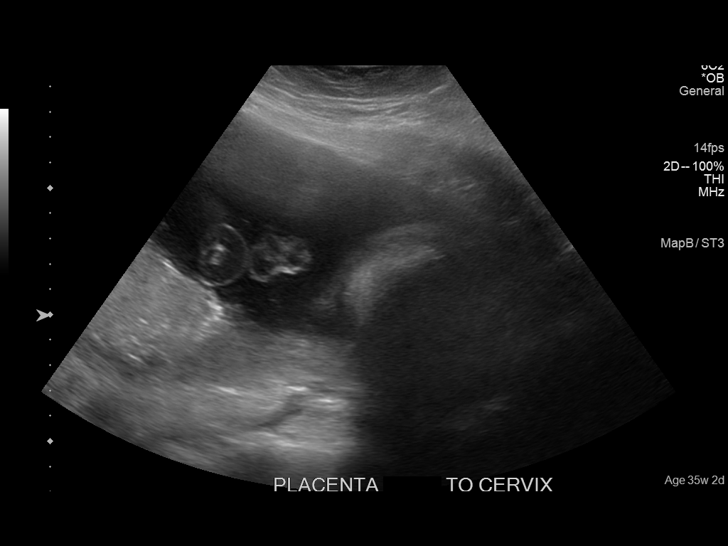
[im 10/14]
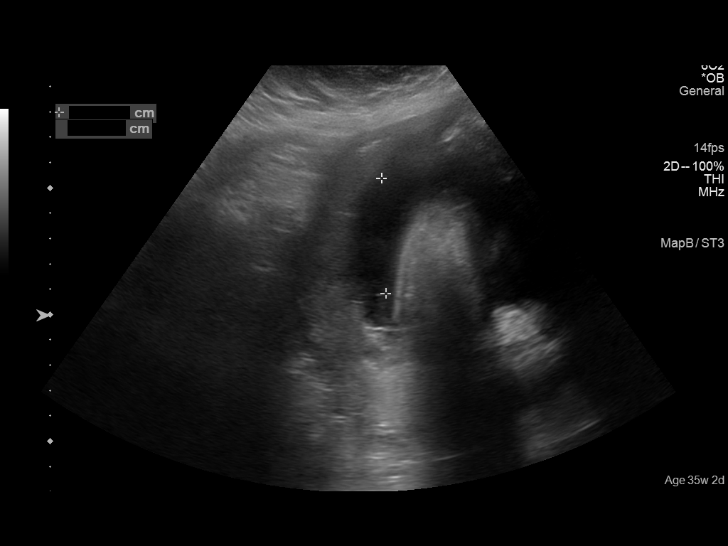
[im 11/14]
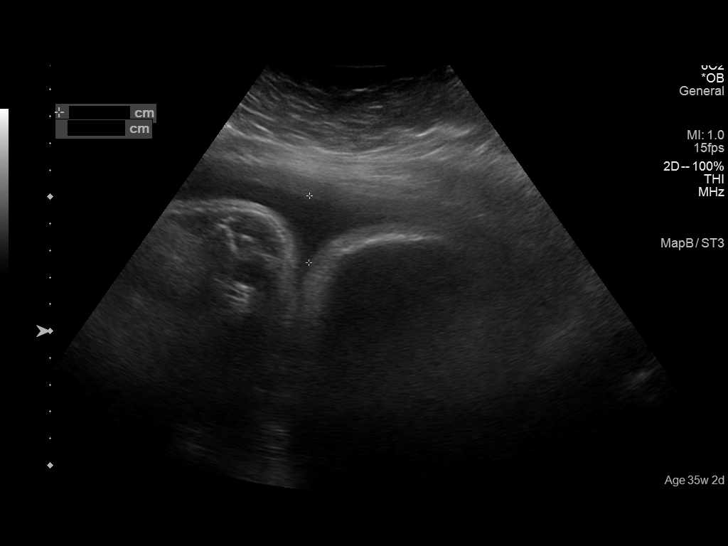
[im 12/14]
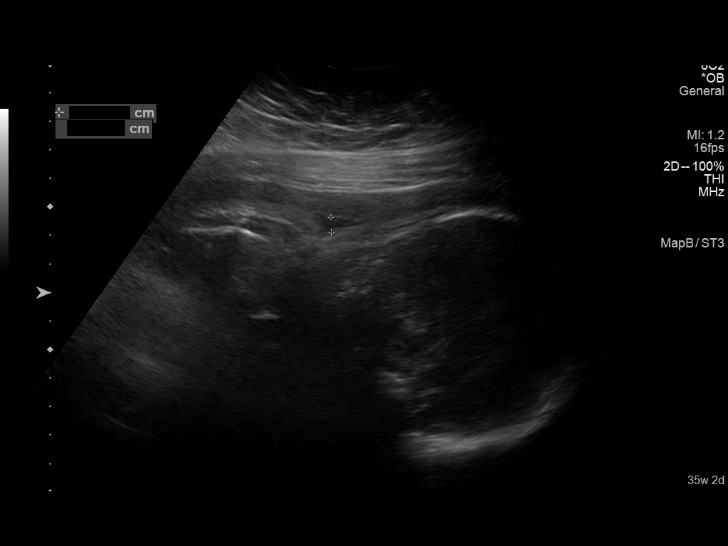
[im 13/14]
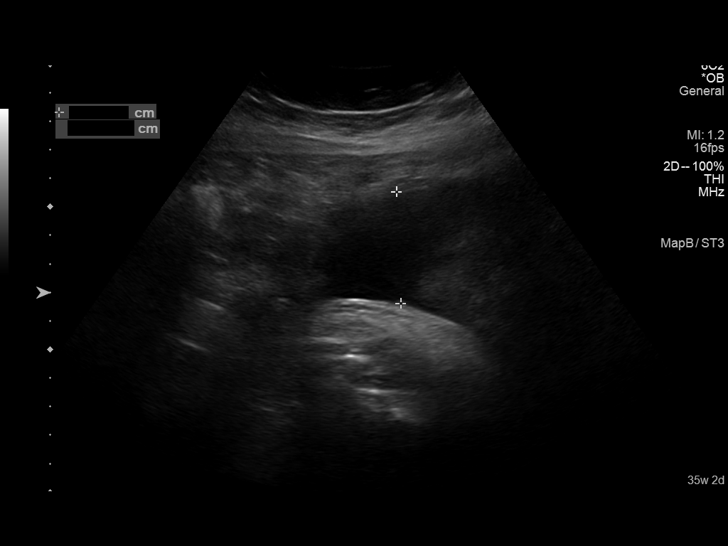
[im 14/14]
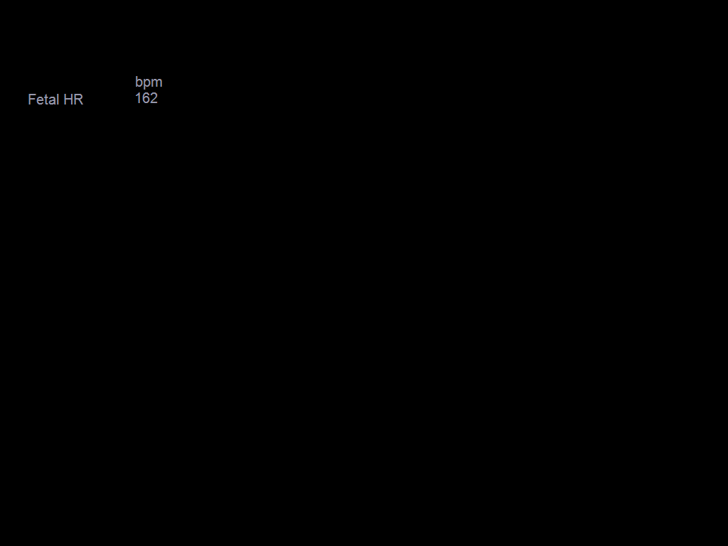

[14 of 14 positions shown; findings below may reference images not displayed]

FINDINGS: Number of Fetuses:  1

Heart Rate:  162 bpm

Movement:  Yes

Presentation: Cephalic

Placental Location: Posterior

Previa: No

Amniotic Fluid (Subjective):  Within normal limits.

AFI:  11.5 cm

MATERNAL FINDINGS:

Cervix: Suboptimally visualized ; measures approximately 3.3 cm in
length.
IMPRESSION: Single living intrauterine fetus in cephalic presentation. Amniotic
fluid is within normal limits with AFI of 11.5 cm.

This exam is performed on an emergent basis and does not
comprehensively evaluate fetal size, dating, or anatomy; follow-up
complete OB US should be considered if further fetal assessment is
warranted.

## 2019-07-01 ENCOUNTER — Other Ambulatory Visit: Payer: Self-pay

## 2019-07-01 ENCOUNTER — Other Ambulatory Visit (HOSPITAL_COMMUNITY): Payer: Self-pay | Admitting: Infectious Diseases

## 2019-07-01 ENCOUNTER — Ambulatory Visit
Admission: RE | Admit: 2019-07-01 | Discharge: 2019-07-01 | Disposition: A | Discharge status: Home / Self Care | Payer: Managed Care, Other (non HMO) | Source: Ambulatory Visit | Attending: Infectious Diseases | Admitting: Infectious Diseases

## 2019-07-01 ENCOUNTER — Other Ambulatory Visit: Payer: Self-pay | Admitting: Infectious Diseases

## 2019-07-01 DIAGNOSIS — R110 Nausea: Secondary | ICD-10-CM | POA: Diagnosis present

## 2019-07-01 DIAGNOSIS — R1031 Right lower quadrant pain: Secondary | ICD-10-CM

## 2019-07-01 MED ORDER — IOHEXOL 300 MG/ML  SOLN
100.0000 mL | Freq: Once | INTRAMUSCULAR | Status: AC | PRN
  Administered 2019-07-01: 100 mL via INTRAVENOUS

## 2021-01-19 IMAGING — CT CT ABD-PELV W/ CM
2 of 4 series · 16 of 46 positions shown, 18 images · IV contrast (omnipaque)
Comparison: None.

CLINICAL DATA: Lower abdominal cramping and diarrhea.

EXAM:
CT ABDOMEN AND PELVIS WITH CONTRAST
TECHNIQUE: Multidetector CT imaging of the abdomen and pelvis was performed
using the standard protocol following bolus administration of
intravenous contrast.
CONTRAST:  100mL OMNIPAQUE IOHEXOL 300 MG/ML  SOLN

[Series 2: abd pelvis · axial · 0.83mm/px · z∈[-1469,-1029]mm · 13 of 98 slices shown, 15 images]
[im 5/98  soft-tissue]
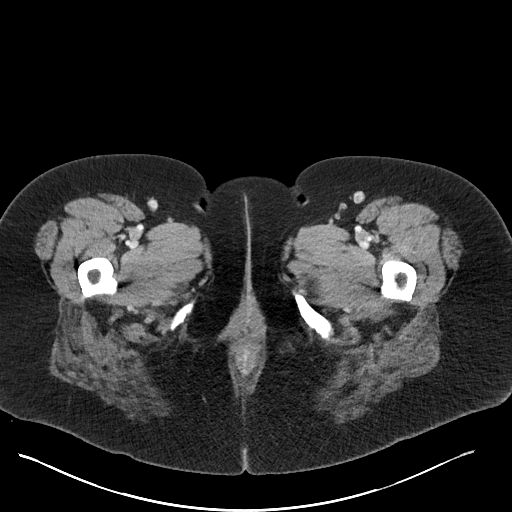
[im 5/98  bone]
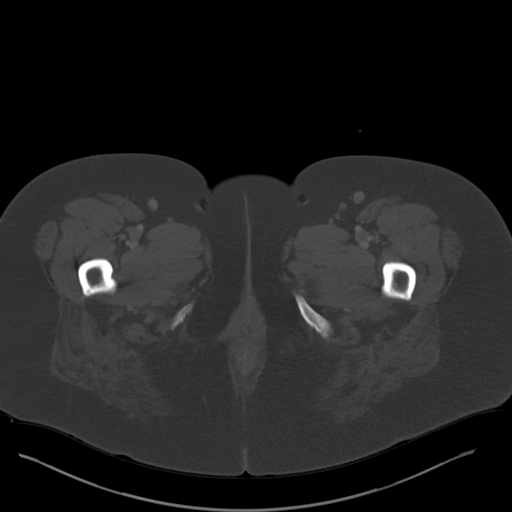
[im 13/98  soft-tissue]
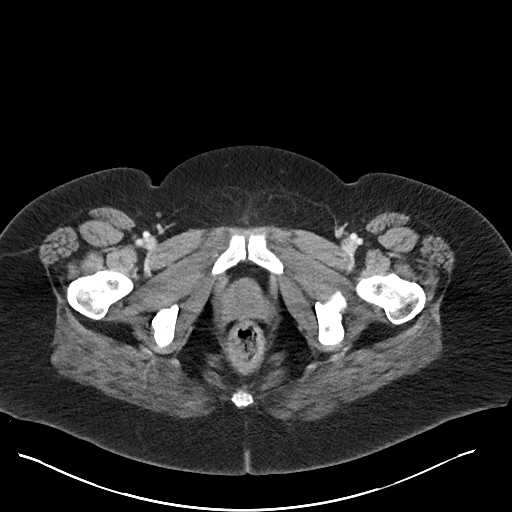
[im 22/98  soft-tissue]
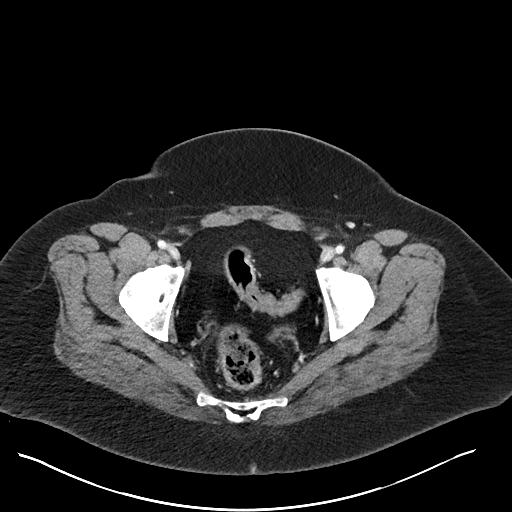
[im 26/98  soft-tissue]
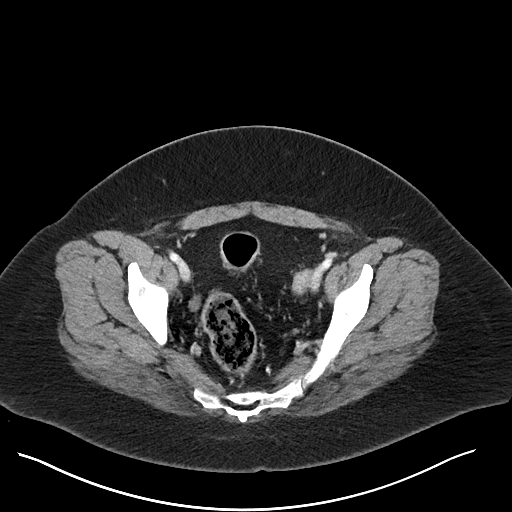
[im 34/98  soft-tissue]
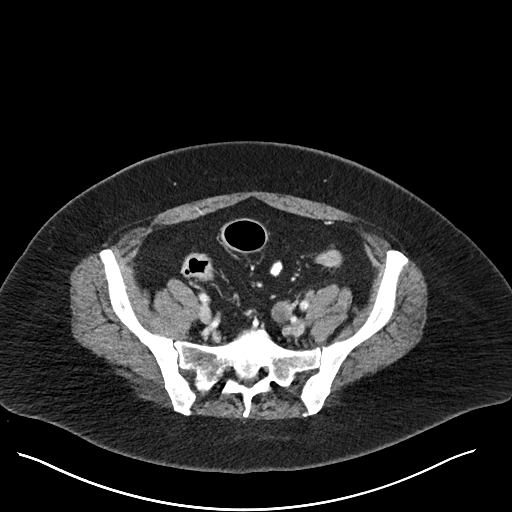
[im 43/98  soft-tissue]
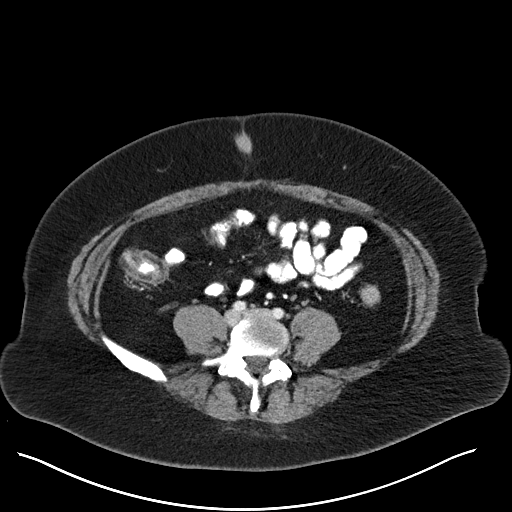
[im 51/98  soft-tissue]
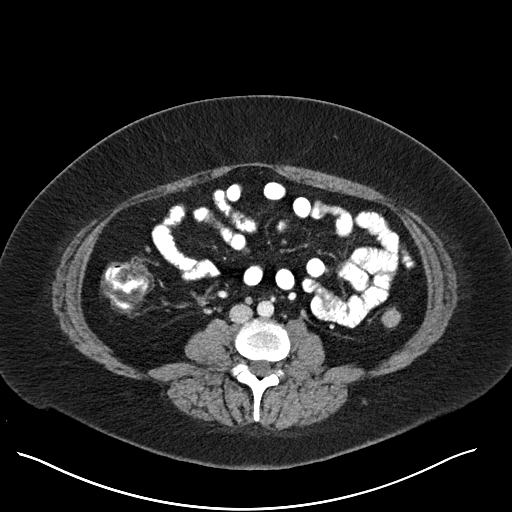
[im 55/98  soft-tissue]
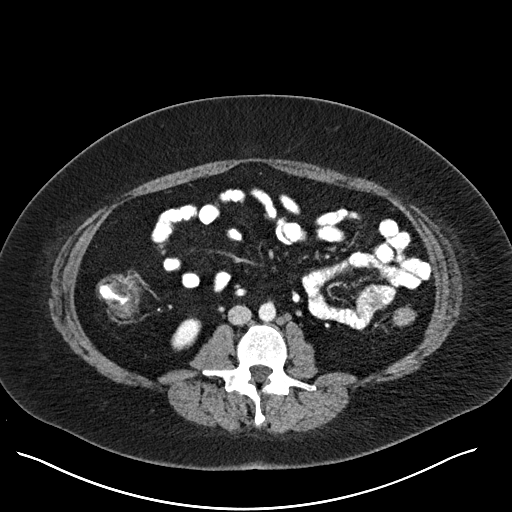
[im 64/98  soft-tissue]
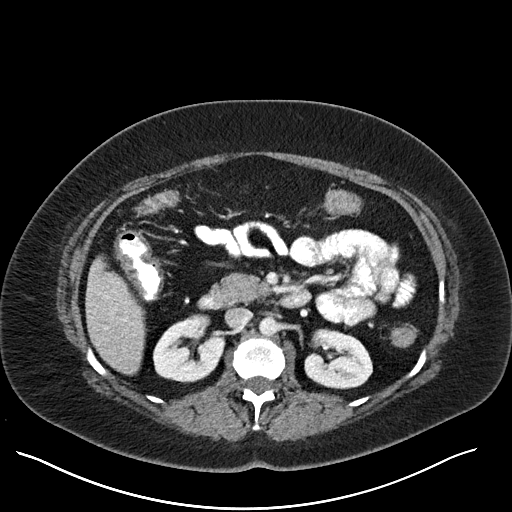
[im 64/98  bone]
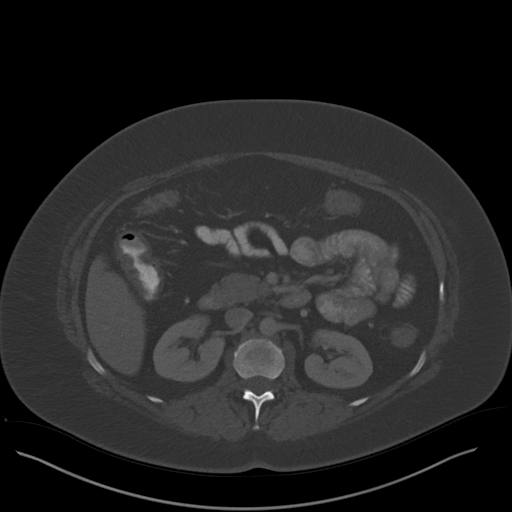
[im 72/98  soft-tissue]
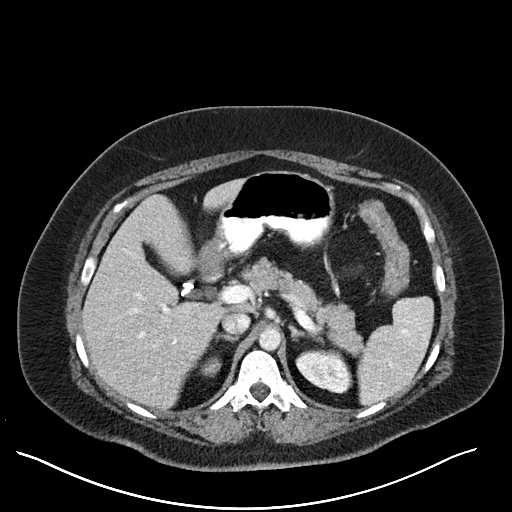
[im 76/98  soft-tissue]
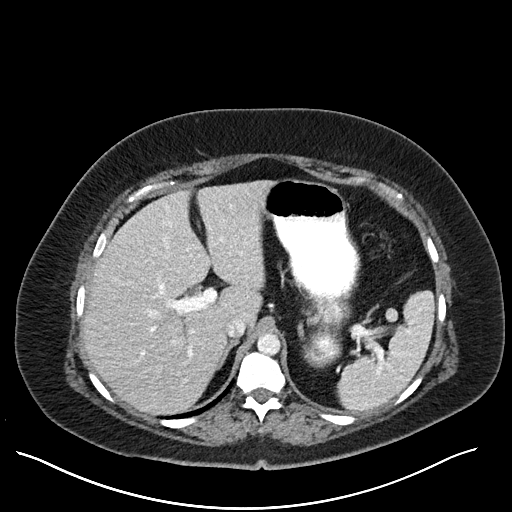
[im 85/98  soft-tissue]
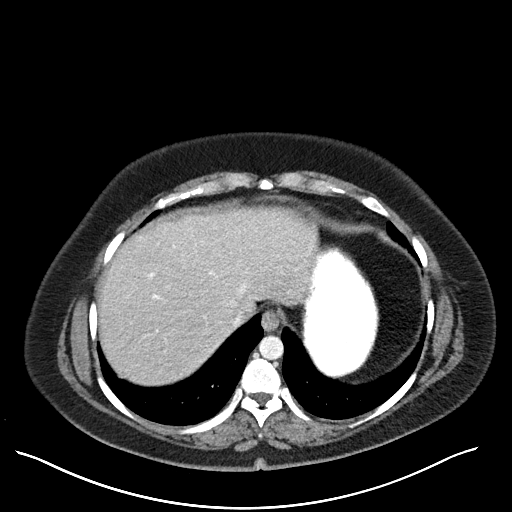
[im 93/98  soft-tissue]
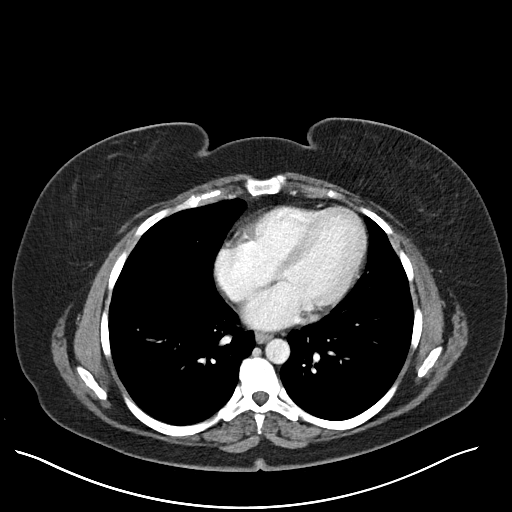

[Series 4: coronals abd pelvis · coronal · 0.83mm/px · 3 of 160 slices shown]
[im 54/160  soft-tissue]
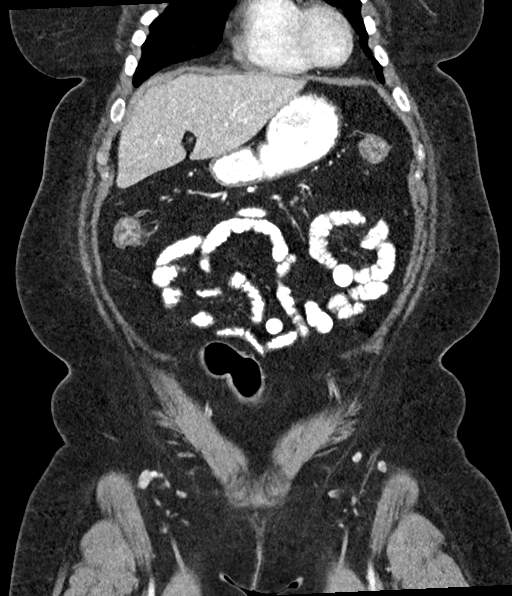
[im 71/160  soft-tissue]
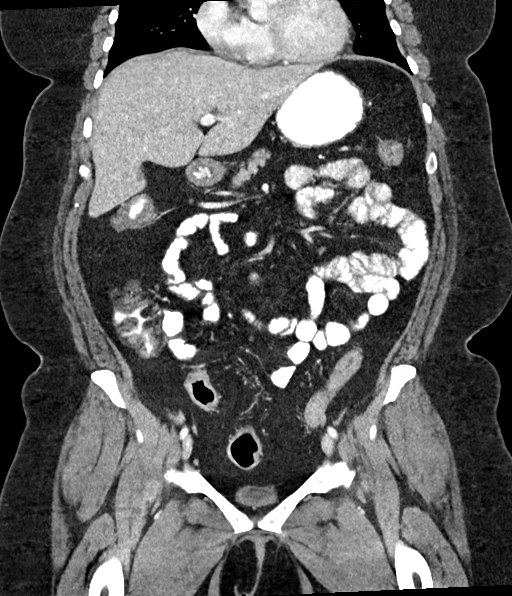
[im 89/160  soft-tissue]
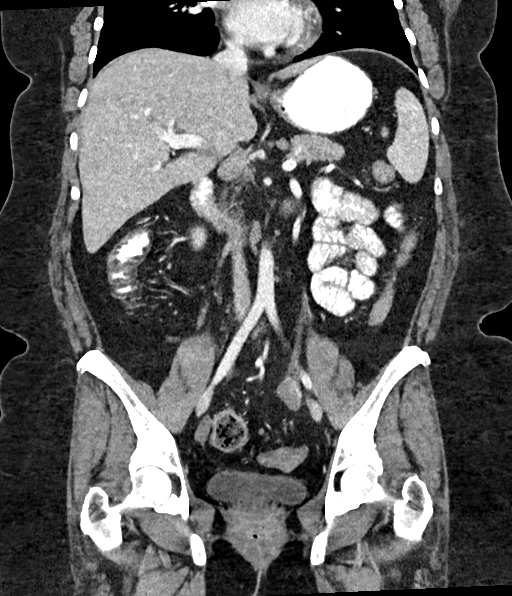

[16 of 46 positions shown; findings below may reference images not displayed]

FINDINGS: Lower chest: Unremarkable.

Hepatobiliary: No suspicious focal abnormality within the liver
parenchyma. Gallbladder is surgically absent. No intrahepatic or
extrahepatic biliary dilation.

Pancreas: No focal mass lesion. No dilatation of the main duct. No
intraparenchymal cyst. No peripancreatic edema.

Spleen: No splenomegaly. No focal mass lesion.

Adrenals/Urinary Tract: No adrenal nodule or mass. Tiny hypodensity
interpolar right kidney is too small to characterize but likely a
cyst. Left kidney unremarkable. No evidence for hydroureter. The
urinary bladder appears normal for the degree of distention.

Stomach/Bowel: Stomach is unremarkable. No gastric wall thickening.
No evidence of outlet obstruction. Duodenum is normally positioned
as is the ligament of Treitz. No small bowel wall thickening. No
small bowel dilatation. The terminal ileum is normal. The appendix
is normal. Colon is diffusely decompressed but there does appear to
be mild diffuse circumferential wall thickening.

Vascular/Lymphatic: No abdominal aortic aneurysm. No abdominal
aortic atherosclerotic calcification. There is no gastrohepatic or
hepatoduodenal ligament lymphadenopathy. No intraperitoneal or
retroperitoneal lymphadenopathy. No pelvic sidewall lymphadenopathy.

Reproductive: The uterus is surgically absent. There is no adnexal
mass.

Other: No intraperitoneal free fluid.

Musculoskeletal: No worrisome lytic or sclerotic osseous
abnormality.
IMPRESSION: 1. Mild but diffuse circumferential wall thickening noted in the
colon with no substantial pericolonic edema/inflammation.
Infectious/inflammatory colitis would be a consideration.
2. Otherwise unremarkable exam.

## 2021-09-12 ENCOUNTER — Other Ambulatory Visit
Admission: RE | Admit: 2021-09-12 | Discharge: 2021-09-12 | Disposition: A | Discharge status: Home / Self Care | Payer: Managed Care, Other (non HMO) | Source: Ambulatory Visit | Attending: Family Medicine | Admitting: Family Medicine

## 2021-09-12 DIAGNOSIS — M25562 Pain in left knee: Secondary | ICD-10-CM | POA: Insufficient documentation

## 2021-09-12 DIAGNOSIS — W19XXXA Unspecified fall, initial encounter: Secondary | ICD-10-CM | POA: Insufficient documentation

## 2021-09-12 DIAGNOSIS — R0602 Shortness of breath: Secondary | ICD-10-CM | POA: Insufficient documentation

## 2021-09-12 DIAGNOSIS — R079 Chest pain, unspecified: Secondary | ICD-10-CM | POA: Insufficient documentation

## 2021-09-12 LAB — TROPONIN I (HIGH SENSITIVITY): Troponin I (High Sensitivity): <2 ng/L (ref <18)

## 2022-01-10 ENCOUNTER — Ambulatory Visit
Admission: RE | Admit: 2022-01-10 | Discharge: 2022-01-10 | Disposition: A | Discharge status: Home / Self Care | Payer: Managed Care, Other (non HMO) | Source: Ambulatory Visit | Attending: Internal Medicine | Admitting: Internal Medicine

## 2022-01-10 ENCOUNTER — Other Ambulatory Visit
Admission: RE | Admit: 2022-01-10 | Discharge: 2022-01-10 | Disposition: A | Discharge status: Home / Self Care | Payer: Managed Care, Other (non HMO) | Source: Ambulatory Visit | Attending: Internal Medicine | Admitting: Internal Medicine

## 2022-01-10 ENCOUNTER — Other Ambulatory Visit: Payer: Self-pay | Admitting: Internal Medicine

## 2022-01-10 DIAGNOSIS — R1011 Right upper quadrant pain: Secondary | ICD-10-CM | POA: Insufficient documentation

## 2022-01-10 DIAGNOSIS — R7989 Other specified abnormal findings of blood chemistry: Secondary | ICD-10-CM

## 2022-01-10 LAB — D-DIMER, QUANTITATIVE: D-Dimer, Quant: 1.44 ug/mL-FEU — ABNORMAL HIGH (ref 0.00–0.50)

## 2022-01-10 MED ORDER — IOPAMIDOL (ISOVUE-370) INJECTION 76%
75.0000 mL | Freq: Once | INTRAVENOUS | Status: AC | PRN
  Administered 2022-01-10: 75 mL via INTRAVENOUS

## 2022-04-08 ENCOUNTER — Other Ambulatory Visit
Admission: RE | Admit: 2022-04-08 | Discharge: 2022-04-08 | Disposition: A | Discharge status: Home / Self Care | Payer: Managed Care, Other (non HMO) | Source: Ambulatory Visit | Attending: Infectious Diseases | Admitting: Infectious Diseases

## 2022-04-08 DIAGNOSIS — R000 Tachycardia, unspecified: Secondary | ICD-10-CM | POA: Diagnosis present

## 2022-04-08 LAB — TROPONIN I (HIGH SENSITIVITY): Troponin I (High Sensitivity): <2 ng/L (ref <18)

## 2022-09-24 ENCOUNTER — Other Ambulatory Visit: Payer: Self-pay | Admitting: Internal Medicine

## 2022-09-24 DIAGNOSIS — Z1231 Encounter for screening mammogram for malignant neoplasm of breast: Secondary | ICD-10-CM

## 2022-10-09 ENCOUNTER — Ambulatory Visit
Admission: RE | Admit: 2022-10-09 | Discharge: 2022-10-09 | Disposition: A | Discharge status: Home / Self Care | Payer: Managed Care, Other (non HMO) | Source: Ambulatory Visit | Attending: Internal Medicine | Admitting: Internal Medicine

## 2022-10-09 DIAGNOSIS — Z1231 Encounter for screening mammogram for malignant neoplasm of breast: Secondary | ICD-10-CM | POA: Diagnosis not present

## 2022-10-15 ENCOUNTER — Other Ambulatory Visit: Payer: Self-pay | Admitting: Internal Medicine

## 2022-10-15 DIAGNOSIS — R928 Other abnormal and inconclusive findings on diagnostic imaging of breast: Secondary | ICD-10-CM

## 2022-10-15 DIAGNOSIS — N6489 Other specified disorders of breast: Secondary | ICD-10-CM

## 2022-10-30 ENCOUNTER — Ambulatory Visit
Admission: RE | Admit: 2022-10-30 | Discharge: 2022-10-30 | Disposition: A | Discharge status: Home / Self Care | Payer: Managed Care, Other (non HMO) | Source: Ambulatory Visit | Attending: Internal Medicine | Admitting: Internal Medicine

## 2022-10-30 DIAGNOSIS — N6489 Other specified disorders of breast: Secondary | ICD-10-CM | POA: Insufficient documentation

## 2022-10-30 DIAGNOSIS — R928 Other abnormal and inconclusive findings on diagnostic imaging of breast: Secondary | ICD-10-CM | POA: Diagnosis not present

## 2022-11-01 ENCOUNTER — Other Ambulatory Visit: Payer: Self-pay | Admitting: Internal Medicine

## 2022-11-01 DIAGNOSIS — N6320 Unspecified lump in the left breast, unspecified quadrant: Secondary | ICD-10-CM

## 2023-04-30 ENCOUNTER — Other Ambulatory Visit: Payer: Self-pay

## 2023-04-30 MED ORDER — WEGOVY 0.25 MG/0.5ML ~~LOC~~ SOAJ
SUBCUTANEOUS | 0 refills | Status: DC
  Filled 2023-04-30: qty 2, 28d supply, fill #0

## 2023-05-01 ENCOUNTER — Ambulatory Visit
Admission: RE | Admit: 2023-05-01 | Discharge: 2023-05-01 | Disposition: A | Discharge status: Home / Self Care | Payer: Managed Care, Other (non HMO) | Source: Ambulatory Visit | Attending: Internal Medicine | Admitting: Internal Medicine

## 2023-05-01 DIAGNOSIS — N6320 Unspecified lump in the left breast, unspecified quadrant: Secondary | ICD-10-CM | POA: Diagnosis present

## 2023-05-06 ENCOUNTER — Encounter: Payer: Self-pay | Admitting: Internal Medicine

## 2023-05-08 ENCOUNTER — Other Ambulatory Visit: Payer: Self-pay | Admitting: Internal Medicine

## 2023-05-08 DIAGNOSIS — N6489 Other specified disorders of breast: Secondary | ICD-10-CM

## 2023-05-09 ENCOUNTER — Other Ambulatory Visit: Payer: Self-pay

## 2023-05-30 ENCOUNTER — Other Ambulatory Visit: Payer: Self-pay

## 2023-05-30 MED ORDER — WEGOVY 0.5 MG/0.5ML ~~LOC~~ SOAJ
0.5000 mg | SUBCUTANEOUS | 0 refills | Status: DC
  Filled 2023-05-30: qty 2, 28d supply, fill #0

## 2023-06-02 ENCOUNTER — Other Ambulatory Visit: Payer: Self-pay

## 2023-07-15 ENCOUNTER — Other Ambulatory Visit: Payer: Self-pay

## 2023-07-15 MED ORDER — WEGOVY 1 MG/0.5ML ~~LOC~~ SOAJ
1.0000 mg | SUBCUTANEOUS | 0 refills | Status: AC
  Filled 2023-07-15: qty 2, 28d supply, fill #0

## 2023-07-23 ENCOUNTER — Other Ambulatory Visit: Payer: Self-pay

## 2024-02-05 ENCOUNTER — Ambulatory Visit
Admission: RE | Admit: 2024-02-05 | Discharge: 2024-02-05 | Disposition: A | Discharge status: Home / Self Care | Source: Ambulatory Visit | Attending: Internal Medicine | Admitting: Internal Medicine

## 2024-02-05 DIAGNOSIS — N6489 Other specified disorders of breast: Secondary | ICD-10-CM

## 2024-11-10 ENCOUNTER — Other Ambulatory Visit: Payer: Self-pay

## 2024-11-10 MED ORDER — SERTRALINE HCL 100 MG PO TABS
100.0000 mg | ORAL_TABLET | Freq: Every day | ORAL | 1 refills | Status: AC
  Filled 2024-11-10: qty 90, 90d supply, fill #0
